# Patient Record
Sex: Female | Born: 1935 | Race: Asian | Hispanic: No | Marital: Married | Smoking: Unknown if ever smoked
Health system: Southern US, Community
[De-identification: ages and names within clinical notes are randomized; demographics above are authoritative.]

## PROBLEM LIST (undated history)

## (undated) ENCOUNTER — Emergency Department (HOSPITAL_COMMUNITY): Payer: Medicare HMO | Source: Home / Self Care

---

## 2017-07-11 ENCOUNTER — Other Ambulatory Visit: Payer: Self-pay

## 2017-07-11 ENCOUNTER — Encounter (HOSPITAL_COMMUNITY): Payer: Self-pay | Admitting: Emergency Medicine

## 2017-07-11 DIAGNOSIS — S0003XA Contusion of scalp, initial encounter: Secondary | ICD-10-CM | POA: Diagnosis not present

## 2017-07-11 DIAGNOSIS — Y999 Unspecified external cause status: Secondary | ICD-10-CM | POA: Insufficient documentation

## 2017-07-11 DIAGNOSIS — Y9389 Activity, other specified: Secondary | ICD-10-CM | POA: Insufficient documentation

## 2017-07-11 DIAGNOSIS — S098XXA Other specified injuries of head, initial encounter: Secondary | ICD-10-CM | POA: Diagnosis present

## 2017-07-11 DIAGNOSIS — Y929 Unspecified place or not applicable: Secondary | ICD-10-CM | POA: Diagnosis not present

## 2017-07-11 DIAGNOSIS — W0110XA Fall on same level from slipping, tripping and stumbling with subsequent striking against unspecified object, initial encounter: Secondary | ICD-10-CM | POA: Diagnosis not present

## 2017-07-11 NOTE — ED Triage Notes (Signed)
Pt daughter reports that pt fell trying to catch the dog and hit back of head. No LOC. Pt alert and oriented x4. No nausea or vomiting. Pt daughter reports incident occurred at 4pm.

## 2017-07-12 ENCOUNTER — Emergency Department (HOSPITAL_COMMUNITY)
Admission: EM | Admit: 2017-07-12 | Discharge: 2017-07-12 | Disposition: A | Discharge status: Home / Self Care | Payer: Medicare HMO | Attending: Emergency Medicine | Admitting: Emergency Medicine

## 2017-07-12 ENCOUNTER — Emergency Department (HOSPITAL_COMMUNITY): Payer: Medicare HMO

## 2017-07-12 DIAGNOSIS — S0003XA Contusion of scalp, initial encounter: Secondary | ICD-10-CM

## 2017-07-12 DIAGNOSIS — W19XXXA Unspecified fall, initial encounter: Secondary | ICD-10-CM

## 2017-07-12 NOTE — Discharge Instructions (Addendum)
You were seen today after a fall.  Your CT scan is negative.  Apply ice and take Tylenol as needed for pain.

## 2017-07-12 NOTE — ED Provider Notes (Signed)
[facility]-EMERGENCY DEPT Provider Note   CSN: [id number] Arrival date & time: 07/11/17  2234     History   Chief Complaint Chief Complaint  Patient presents with  . Fall    HPI Teresa Griffin is a 84 y.o. female.  HPI  This is an 84 year old female who presents with her family with concerns for a fall.  At approximately 4 PM, patient was attempting to restrain a dog when she fell backwards hitting her head.  She did not lose consciousness.  She has not had any nausea or vomiting.  She denies any pain except for when you touch the back of her head.  She denies any neck pain.  She is not on any anticoagulants.  They have used ice with some relief.  Patient's daughter states that her daughter-in-law is a Engineer, civil (consulting) and recommended she get "checked out."  She denies any other pain.  She has been ambulatory.  She has not taken anything for pain.  History reviewed. No pertinent past medical history.  There are no active problems to display for this patient.   History reviewed. No pertinent surgical history.   OB History   None      Home Medications    Prior to Admission medications   Not on File    Family History History reviewed. No pertinent family history.  Social History Social History   Tobacco Use  . Smoking status: Never Smoker  . Smokeless tobacco: Never Used  Substance Use Topics  . Alcohol use: Never    Frequency: Never  . Drug use: Never     Allergies   Patient has no known allergies.   Review of Systems Review of Systems  Respiratory: Negative for shortness of breath.   Cardiovascular: Negative for chest pain.  Gastrointestinal: Negative for nausea and vomiting.  Musculoskeletal: Negative for neck pain.  Skin: Positive for wound.  Neurological: Negative for headaches.  All other systems reviewed and are negative.    Physical Exam Updated Vital Signs BP (!) 177/83 (BP Location: Left Arm)   Pulse 80   Temp 98 F (36.7 C)  (Oral)   Resp 18   Ht 5' (1.524 m)   Wt 51.7 kg (114 lb)   SpO2 100%   BMI 22.26 kg/m   Physical Exam  Constitutional: She is oriented to person, place, and time. She appears well-developed and well-nourished. No distress.  HENT:  Head: Normocephalic.  Hematoma with tenderness palpation to the posterior left occiput  Eyes: Pupils are equal, round, and reactive to light. EOM are normal.  Neck: Normal range of motion. Neck supple.  No midline C-spine tenderness to palpation, step-off, deformity  Cardiovascular: Normal rate, regular rhythm and normal heart sounds.  Pulmonary/Chest: Effort normal. No respiratory distress. She has no wheezes.  Abdominal: Soft. There is no tenderness.  Musculoskeletal: She exhibits no edema or deformity.  Neurological: She is alert and oriented to person, place, and time.  Cranial nerves II through XII intact  Skin: Skin is warm and dry.  Psychiatric: She has a normal mood and affect.  Nursing note and vitals reviewed.    ED Treatments / Results  Labs (all labs ordered are listed, but only abnormal results are displayed) Labs Reviewed - No data to display  EKG None  Radiology Ct Head Wo Contrast  Result Date: 07/12/2017 CLINICAL DATA:  Patient fell at 1600 hours on 07/11/17. Struck posterior head. No loss of consciousness. EXAM: CT HEAD WITHOUT CONTRAST TECHNIQUE: Contiguous  axial images were obtained from the base of the skull through the vertex without intravenous contrast. COMPARISON:  None. FINDINGS: Brain: Mild cerebral atrophy. Mild patchy low-attenuation changes in the deep white matter consistent small vessel ischemia. No evidence of acute infarction, hemorrhage, hydrocephalus, extra-axial collection or mass lesion/mass effect. Vascular: Intracranial arterial vascular calcifications are present. Skull: No depressed skull fractures are identified. Lucencies demonstrated in the posterior skull base likely representing venous legs.  Sinuses/Orbits: Paranasal sinuses and mastoid air cells are not opacified. Other: None IMPRESSION: No acute intracranial abnormalities. Chronic atrophy and small vessel ischemic changes. Electronically Signed   By: Burman Nieves M.D.   On: 07/12/2017 02:32    Procedures Procedures (including critical care time)  Medications Ordered in ED Medications - No data to display   Initial Impression / Assessment and Plan / ED Course  I have reviewed the triage vital signs and the nursing notes.  Pertinent labs & imaging results that were available during my care of the patient were reviewed by me and considered in my medical decision making (see chart for details).     She presents after fall greater than 8 hours ago.  She is clinically well-appearing and nontoxic.  Neurologically intact.  She does have evidence of a small hematoma to her posterior scalp.  Given her age, she falls into high risk criteria even though she is not on any anticoagulation.  I discussed this with the family.  They are agreeable to CT scan.  2:42 AM CT scan negative.  Patient and family updated.  Will discharge home with supportive measures.  Apply ice and take Tylenol as needed.  After history, exam, and medical workup I feel the patient has been appropriately medically screened and is safe for discharge home. Pertinent diagnoses were discussed with the patient. Patient was given return precautions.   Final Clinical Impressions(s) / ED Diagnoses   Final diagnoses:  Hematoma of scalp, initial encounter  Fall, initial encounter    ED Discharge Orders    None       Horton, Mayer Masker, MD 07/12/17 [phone]

## 2020-01-12 ENCOUNTER — Emergency Department (HOSPITAL_COMMUNITY): Payer: Medicare HMO

## 2020-01-12 ENCOUNTER — Other Ambulatory Visit: Payer: Self-pay

## 2020-01-12 ENCOUNTER — Inpatient Hospital Stay (HOSPITAL_COMMUNITY)
Admission: EM | Admit: 2020-01-12 | Discharge: 2020-02-16 | DRG: 082 | Disposition: E | Payer: Medicare HMO | Attending: Pulmonary Disease | Admitting: Pulmonary Disease

## 2020-01-12 ENCOUNTER — Encounter (HOSPITAL_COMMUNITY): Payer: Self-pay | Admitting: *Deleted

## 2020-01-12 DIAGNOSIS — S06349A Traumatic hemorrhage of right cerebrum with loss of consciousness of unspecified duration, initial encounter: Secondary | ICD-10-CM | POA: Diagnosis not present

## 2020-01-12 DIAGNOSIS — S065X9A Traumatic subdural hemorrhage with loss of consciousness of unspecified duration, initial encounter: Secondary | ICD-10-CM | POA: Diagnosis not present

## 2020-01-12 DIAGNOSIS — Z515 Encounter for palliative care: Secondary | ICD-10-CM | POA: Diagnosis not present

## 2020-01-12 DIAGNOSIS — I619 Nontraumatic intracerebral hemorrhage, unspecified: Secondary | ICD-10-CM | POA: Diagnosis present

## 2020-01-12 DIAGNOSIS — W19XXXA Unspecified fall, initial encounter: Secondary | ICD-10-CM | POA: Diagnosis present

## 2020-01-12 DIAGNOSIS — E785 Hyperlipidemia, unspecified: Secondary | ICD-10-CM | POA: Diagnosis present

## 2020-01-12 DIAGNOSIS — Z20822 Contact with and (suspected) exposure to covid-19: Secondary | ICD-10-CM | POA: Diagnosis present

## 2020-01-12 DIAGNOSIS — J690 Pneumonitis due to inhalation of food and vomit: Secondary | ICD-10-CM | POA: Diagnosis present

## 2020-01-12 DIAGNOSIS — J9601 Acute respiratory failure with hypoxia: Secondary | ICD-10-CM | POA: Diagnosis present

## 2020-01-12 DIAGNOSIS — Z79899 Other long term (current) drug therapy: Secondary | ICD-10-CM | POA: Diagnosis not present

## 2020-01-12 DIAGNOSIS — Z66 Do not resuscitate: Secondary | ICD-10-CM | POA: Diagnosis present

## 2020-01-12 DIAGNOSIS — I10 Essential (primary) hypertension: Secondary | ICD-10-CM | POA: Diagnosis present

## 2020-01-12 DIAGNOSIS — S065XAA Traumatic subdural hemorrhage with loss of consciousness status unknown, initial encounter: Secondary | ICD-10-CM

## 2020-01-12 DIAGNOSIS — R4189 Other symptoms and signs involving cognitive functions and awareness: Secondary | ICD-10-CM

## 2020-01-12 DIAGNOSIS — Y92009 Unspecified place in unspecified non-institutional (private) residence as the place of occurrence of the external cause: Secondary | ICD-10-CM | POA: Diagnosis not present

## 2020-01-12 DIAGNOSIS — I609 Nontraumatic subarachnoid hemorrhage, unspecified: Secondary | ICD-10-CM

## 2020-01-12 LAB — CBC WITH DIFFERENTIAL/PLATELET
Abs Immature Granulocytes: 0 10*3/uL (ref 0.00–0.07)
Basophils Absolute: 0 10*3/uL (ref 0.0–0.1)
Basophils Relative: 0 %
Eosinophils Absolute: 0 10*3/uL (ref 0.0–0.5)
Eosinophils Relative: 0 %
HCT: 32.2 % — ABNORMAL LOW (ref 36.0–46.0)
Hemoglobin: 10.4 g/dL — ABNORMAL LOW (ref 12.0–15.0)
Immature Granulocytes: 0 %
Lymphocytes Relative: 9 %
Lymphs Abs: 0.4 10*3/uL — ABNORMAL LOW (ref 0.7–4.0)
MCH: 31.3 pg (ref 26.0–34.0)
MCHC: 32.3 g/dL (ref 30.0–36.0)
MCV: 97 fL (ref 80.0–100.0)
Monocytes Absolute: 0.3 10*3/uL (ref 0.1–1.0)
Monocytes Relative: 6 %
Neutro Abs: 4 10*3/uL (ref 1.7–7.7)
Neutrophils Relative %: 85 %
Platelets: 116 10*3/uL — ABNORMAL LOW (ref 150–400)
RBC: 3.32 MIL/uL — ABNORMAL LOW (ref 3.87–5.11)
RDW: 12.7 % (ref 11.5–15.5)
WBC: 4.7 10*3/uL (ref 4.0–10.5)
nRBC: 0 % (ref 0.0–0.2)

## 2020-01-12 LAB — COMPREHENSIVE METABOLIC PANEL
ALT: 13 U/L (ref 0–44)
AST: 20 U/L (ref 15–41)
Albumin: 3.6 g/dL (ref 3.5–5.0)
Alkaline Phosphatase: 39 U/L (ref 38–126)
Anion gap: 15 (ref 5–15)
BUN: 30 mg/dL — ABNORMAL HIGH (ref 8–23)
CO2: 17 mmol/L — ABNORMAL LOW (ref 22–32)
Calcium: 9.9 mg/dL (ref 8.9–10.3)
Chloride: 106 mmol/L (ref 98–111)
Creatinine, Ser: 1.51 mg/dL — ABNORMAL HIGH (ref 0.44–1.00)
GFR, Estimated: 32 mL/min — ABNORMAL LOW (ref >60)
Glucose, Bld: 215 mg/dL — ABNORMAL HIGH (ref 70–99)
Potassium: 4.1 mmol/L (ref 3.5–5.1)
Sodium: 138 mmol/L (ref 135–145)
Total Bilirubin: 0.7 mg/dL (ref 0.3–1.2)
Total Protein: 7.3 g/dL (ref 6.5–8.1)

## 2020-01-12 LAB — URINALYSIS, ROUTINE W REFLEX MICROSCOPIC
Bacteria, UA: NONE SEEN
Bilirubin Urine: NEGATIVE
Glucose, UA: >=500 mg/dL — AB
Ketones, ur: NEGATIVE mg/dL
Leukocytes,Ua: NEGATIVE
Nitrite: NEGATIVE
Protein, ur: 100 mg/dL — AB
Specific Gravity, Urine: 1.013 (ref 1.005–1.030)
pH: 5 (ref 5.0–8.0)

## 2020-01-12 LAB — RAPID URINE DRUG SCREEN, HOSP PERFORMED
Amphetamines: NOT DETECTED
Barbiturates: NOT DETECTED
Benzodiazepines: NOT DETECTED
Cocaine: NOT DETECTED
Opiates: NOT DETECTED
Tetrahydrocannabinol: NOT DETECTED

## 2020-01-12 LAB — AMMONIA: Ammonia: 17 umol/L (ref 9–35)

## 2020-01-12 LAB — LIPASE, BLOOD: Lipase: 57 U/L — ABNORMAL HIGH (ref 11–51)

## 2020-01-12 LAB — TROPONIN I (HIGH SENSITIVITY)
Troponin I (High Sensitivity): 28 ng/L — ABNORMAL HIGH (ref <18)
Troponin I (High Sensitivity): 33 ng/L — ABNORMAL HIGH (ref <18)

## 2020-01-12 LAB — RESP PANEL BY RT-PCR (FLU A&B, COVID) ARPGX2
Influenza A by PCR: NEGATIVE
Influenza B by PCR: NEGATIVE
SARS Coronavirus 2 by RT PCR: NEGATIVE

## 2020-01-12 LAB — TSH: TSH: 0.67 u[IU]/mL (ref 0.350–4.500)

## 2020-01-12 LAB — LACTIC ACID, PLASMA
Lactic Acid, Venous: 4.6 mmol/L (ref 0.5–1.9)
Lactic Acid, Venous: 6.8 mmol/L (ref 0.5–1.9)

## 2020-01-12 LAB — ETHANOL: Alcohol, Ethyl (B): <10 mg/dL (ref <10)

## 2020-01-12 LAB — MRSA PCR SCREENING: MRSA by PCR: NEGATIVE

## 2020-01-12 MED ORDER — GLYCOPYRROLATE 1 MG PO TABS
1.0000 mg | ORAL_TABLET | ORAL | Status: DC | PRN
  Filled 2020-01-12: qty 1

## 2020-01-12 MED ORDER — CLEVIDIPINE BUTYRATE 0.5 MG/ML IV EMUL
0.0000 mg/h | INTRAVENOUS | Status: DC
  Administered 2020-01-12: 5 mg/h via INTRAVENOUS
  Filled 2020-01-12: qty 50

## 2020-01-12 MED ORDER — VANCOMYCIN HCL 750 MG/150ML IV SOLN: 750.0000 mg | INTRAVENOUS | Status: DC

## 2020-01-12 MED ORDER — LACTATED RINGERS IV BOLUS (SEPSIS)
1000.0000 mL | Freq: Once | INTRAVENOUS | Status: AC
  Administered 2020-01-12: 1000 mL via INTRAVENOUS

## 2020-01-12 MED ORDER — ETOMIDATE 2 MG/ML IV SOLN
INTRAVENOUS | Status: AC | PRN
  Administered 2020-01-12: 20 mg via INTRAVENOUS

## 2020-01-12 MED ORDER — MIDAZOLAM HCL 2 MG/2ML IJ SOLN: 2.0000 mg | INTRAMUSCULAR | Status: DC | PRN

## 2020-01-12 MED ORDER — ORAL CARE MOUTH RINSE
15.0000 mL | OROMUCOSAL | Status: DC
  Administered 2020-01-12 – 2020-01-15 (×28): 15 mL via OROMUCOSAL

## 2020-01-12 MED ORDER — ACETAMINOPHEN 650 MG RE SUPP: 650.0000 mg | Freq: Four times a day (QID) | RECTAL | Status: DC | PRN

## 2020-01-12 MED ORDER — PIPERACILLIN-TAZOBACTAM IN DEX 2-0.25 GM/50ML IV SOLN
2.2500 g | Freq: Three times a day (TID) | INTRAVENOUS | Status: DC
  Filled 2020-01-12: qty 50

## 2020-01-12 MED ORDER — CHLORHEXIDINE GLUCONATE 0.12% ORAL RINSE (MEDLINE KIT)
15.0000 mL | Freq: Two times a day (BID) | OROMUCOSAL | Status: DC
  Administered 2020-01-12 – 2020-01-15 (×7): 15 mL via OROMUCOSAL

## 2020-01-12 MED ORDER — DIPHENHYDRAMINE HCL 50 MG/ML IJ SOLN: 25.0000 mg | INTRAMUSCULAR | Status: DC | PRN

## 2020-01-12 MED ORDER — LACTATED RINGERS IV BOLUS (SEPSIS)
500.0000 mL | Freq: Once | INTRAVENOUS | Status: AC
  Administered 2020-01-12: 500 mL via INTRAVENOUS

## 2020-01-12 MED ORDER — GLYCOPYRROLATE 0.2 MG/ML IJ SOLN: 0.2000 mg | INTRAMUSCULAR | Status: DC | PRN

## 2020-01-12 MED ORDER — LACTATED RINGERS IV BOLUS (SEPSIS)
250.0000 mL | Freq: Once | INTRAVENOUS | Status: AC
  Administered 2020-01-12: 250 mL via INTRAVENOUS

## 2020-01-12 MED ORDER — CHLORHEXIDINE GLUCONATE CLOTH 2 % EX PADS
6.0000 | MEDICATED_PAD | Freq: Every day | CUTANEOUS | Status: DC
  Administered 2020-01-12 – 2020-01-15 (×3): 6 via TOPICAL

## 2020-01-12 MED ORDER — MORPHINE SULFATE (PF) 2 MG/ML IV SOLN
2.0000 mg | INTRAVENOUS | Status: DC | PRN
  Administered 2020-01-13: 2 mg via INTRAVENOUS
  Administered 2020-01-13: 4 mg via INTRAVENOUS
  Filled 2020-01-12: qty 2
  Filled 2020-01-12: qty 1

## 2020-01-12 MED ORDER — POLYVINYL ALCOHOL 1.4 % OP SOLN
1.0000 [drp] | Freq: Four times a day (QID) | OPHTHALMIC | Status: DC | PRN
  Filled 2020-01-12: qty 15

## 2020-01-12 MED ORDER — SODIUM CHLORIDE 0.9 % IV SOLN
2.0000 g | Freq: Once | INTRAVENOUS | Status: DC
  Filled 2020-01-12: qty 2

## 2020-01-12 MED ORDER — PIPERACILLIN-TAZOBACTAM IN DEX 2-0.25 GM/50ML IV SOLN: 2.2500 g | Freq: Once | INTRAVENOUS | Status: DC

## 2020-01-12 MED ORDER — IOHEXOL 350 MG/ML SOLN
80.0000 mL | Freq: Once | INTRAVENOUS | Status: AC | PRN
  Administered 2020-01-12: 80 mL via INTRAVENOUS

## 2020-01-12 MED ORDER — VANCOMYCIN HCL IN DEXTROSE 1-5 GM/200ML-% IV SOLN
1000.0000 mg | Freq: Once | INTRAVENOUS | Status: AC
  Administered 2020-01-12: 1000 mg via INTRAVENOUS
  Filled 2020-01-12: qty 200

## 2020-01-12 MED ORDER — LACTATED RINGERS IV SOLN: INTRAVENOUS | Status: DC

## 2020-01-12 MED ORDER — PROPOFOL 1000 MG/100ML IV EMUL
5.0000 ug/kg/min | INTRAVENOUS | Status: DC
  Administered 2020-01-12: 5 ug/kg/min via INTRAVENOUS
  Filled 2020-01-12: qty 100

## 2020-01-12 MED ORDER — ACETAMINOPHEN 325 MG PO TABS: 650.0000 mg | ORAL_TABLET | Freq: Four times a day (QID) | ORAL | Status: DC | PRN

## 2020-01-12 MED ORDER — ROCURONIUM BROMIDE 50 MG/5ML IV SOLN
INTRAVENOUS | Status: AC | PRN
  Administered 2020-01-12: 2720 mg via INTRAVENOUS

## 2020-01-12 MED ORDER — PIPERACILLIN-TAZOBACTAM 3.375 G IVPB 30 MIN
3.3750 g | Freq: Once | INTRAVENOUS | Status: DC
  Administered 2020-01-12: 3.375 g via INTRAVENOUS
  Filled 2020-01-12: qty 50

## 2020-01-12 NOTE — Progress Notes (Signed)
[facility] notified (CDS). Referral number: [id number]. Thank you

## 2020-01-12 NOTE — Progress Notes (Signed)
Spoke with son, Maryjane Hurter, and daughter-in-law who is a [employer] manager. Provided with details of brain trauma and reassured them that we will keep patient comfortable as we await family's arrival. They predict that daughter, who is in [place], will not be able to arrive until Thursday.

## 2020-01-12 NOTE — ED Notes (Signed)
Called to give report to 4N, nurse for patient is off the floor, said to give her 10-15 minutes.

## 2020-01-12 NOTE — Consult Note (Signed)
Reason for Consult: ICH Referring Physician: EDP  Teresa Griffin is an 86 y.o. female.   HPI:  86 year old female presented to the ED today after sustaining a fall yesterday morning. Her husband found her to be unresponsive this morning. She was intubated in the ED for airway protection since she was aspirating.   History reviewed. No pertinent past medical history.  History reviewed. No pertinent surgical history.  Not on File  Social History   Tobacco Use  . Smoking status: Unknown If Ever Smoked  Substance Use Topics  . Alcohol use: Not on file    History reviewed. No pertinent family history.   Review of Systems  Positive ROS: intubated and sedated  All other systems have been reviewed and were otherwise negative with the exception of those mentioned in the HPI and as above.  Objective: Vital signs in last 24 hours: Temp:  [97.2 F (36.2 C)-99.5 F (37.5 C)] 97.6 F (36.4 C) (11/27 1215) Pulse Rate:  [79-114] 108 (11/27 1215) Resp:  [18-29] 27 (11/27 1215) BP: (145-178)/(66-102) 145/74 (11/27 1215) SpO2:  [86 %-100 %] 86 % (11/27 1215) FiO2 (%):  [40 %] 40 % (11/27 0934) Weight:  [54.4 kg] 54.4 kg (11/27 0938)  General Appearance: intubated  Head: Normocephalic, without obvious abnormality, atraumatic Eyes: Left pupil fixed 4, right pupil fixed2   Throat: ETT Lungs: respirations unlabored, ETT Heart: Regular rate and rhythm Pulses: 2+ and symmetric all extremities Skin: Skin color, texture, turgor normal, no rashes or lesions  NEUROLOGIC:   Mental status: obtunded Motor Exam - 0/5 all extremities Sensory Exam - uta Reflexes: not tested Coordination uta Gait -uta Balance - uta Cranial Nerves: I: smell Not tested  II: visual acuity  OS: na  OD: na  II: visual fields fixed  II: pupils L 4 fixed, right 2 fixed  III,VII: ptosis None  III,IV,VI: extraocular muscles  fixed  V: mastication uta  V: facial light touch sensation  uta  V,VII: corneal reflex  No  corneal reflex  VII: facial muscle function - upper  uta  VII: facial muscle function - lower uta  VIII: hearing uta  IX: soft palate elevation  uta  IX,X: gag reflex none  XI: trapezius strength  uta  XI: sternocleidomastoid strength uta  XI: neck flexion strength  uta  XII: tongue strength  uta    Data Review Lab Results  Component Value Date   WBC 4.7 01-21-2020   HGB 10.4 (L) 2020-01-21   HCT 32.2 (L) Jan 21, 2020   MCV 97.0 01-21-2020   PLT 116 (L) Jan 21, 2020   Lab Results  Component Value Date   NA 138 2020-01-21   K 4.1 2020-01-21   CL 106 01-21-2020   CO2 17 (L) Jan 21, 2020   BUN 30 (H) 21-Jan-2020   CREATININE 1.51 (H) 2020/01/21   GLUCOSE 215 (H) Jan 21, 2020   No results found for: INR, PROTIME  Radiology: CT Head Wo Contrast  Result Date: 01-21-2020 CLINICAL DATA:  Pain following fall EXAM: CT HEAD WITHOUT CONTRAST CT CERVICAL SPINE WITHOUT CONTRAST TECHNIQUE: Multidetector CT imaging of the head and cervical spine was performed following the standard protocol without intravenous contrast. Multiplanar CT image reconstructions of the cervical spine were also generated. COMPARISON:  None. FINDINGS: CT HEAD FINDINGS Brain: There is extensive acute subdural hematoma seen on the left involving the left temporal, left frontal, and left parietal lobes. This subdural hematoma on the left is maximum in thickness in the temporal region with a maximum  measured thickness of 1.7 cm. This subdural hematoma is causing 1.5 cm of midline shift toward the right. There is an apparent frontal contusion with hemorrhage and slight surrounding edema anteriorly measuring 1.6 x 1.5 cm. There is immediately adjacent subdural hemorrhage tracking along the falx. There is subarachnoid hemorrhage in the right temporoparietal junction region without appreciable mass effect. More inferiorly, there is a left tentorial subdural hematoma. There is subarachnoid hemorrhage in the left caudate nucleus tail  region in adjacent inferomedial left temporal lobe which is causing mass effect on the uncus on the left. There is hemorrhage in the anterior superior cerebellum which is felt to be subarachnoid with mild surrounding edema measuring 2.3 x 1.2 cm. There is also subarachnoid hemorrhage tracking from the lateral left midbrain into the upper pons region. Vascular: No hyperdense vessel. There is calcification in each carotid siphon region. Skull: There is a nondisplaced fracture of each anterior temporal bone. No depressed skull fracture evident. Sinuses/Orbits: Mucosal thickening noted in several ethmoid air cells. Orbits appear symmetric bilaterally. Other: Mastoid air cells are clear. CT CERVICAL SPINE FINDINGS Alignment: There is cervical dextroscoliosis. No evidence spondylolisthesis. Skull base and vertebrae: Skull base and craniocervical junction regions unremarkable. There is pannus posterior to the odontoid which is not causing significant impression on craniocervical junction. No fracture is appreciable. No blastic or lytic bone lesions. Soft tissues and spinal canal: Prevertebral soft tissues and predental space regions are within normal limits. Note that the patient is intubated. No evident cord or canal hematoma. No paraspinous lesions. Disc levels: There is moderately severe disc space narrowing at C5-6. There is milder disc space narrowing at C3-4 and C4-5. There are prominent anterior and posterior osteophytes at C4, C5, and C6. There is a degree spinal stenosis at C4-5 due to central calcified osteophyte impressing on the ventral cord. There is spinal stenosis due to large central and right paracentral calcified spurring and disc protrusion at C5-6 with marked impression on the proximal C6 nerve root on the right due to bony hypertrophy and disc protrusion. Facet hypertrophy noted at several levels. Upper chest: Patient intubated. Mild scarring in the left upper lobe. No pneumothorax evident. Noah edema  or consolidation. Other: Foci of calcification noted in each carotid artery. IMPRESSION: CT head: 1. Acute subdural hematoma in the left frontal, left temporal, and left parietal regions measuring maximum thickness in the temporal region 1.7 cm. 1.5 cm of midline shift to the right at the lateral and third ventricular level. 2.  Falcine and left tentorial acute subdural hematomas. 3. Brain contusion with hemorrhage in the anterior superior frontal lobe, more severe on the left than on the right. This apparent contusion measures 1.6 x 1.5 cm. 4. Subarachnoid hemorrhage in the right temporoparietal region without mass effect. 5. Subarachnoid hemorrhage in the inferomedial left temporal lobe involving a portion of the tail of the caudate nucleus on the left causing mass effect on the left uncus. Subarachnoid hemorrhage seen in the lateral lower left midbrain extending into the left upper pons. Subarachnoid hemorrhage in the anterior upper right cerebellum measuring 2.3 x 1.2 cm with surrounding edema causing mass effect on the fourth ventricle. 6. Nondisplaced fractures of each anterior temporal bone. No displaced skull fractures. 7.  Foci of arterial vascular calcification. 8.  Mucosal thickening in several ethmoid air cells. CT cervical spine: 1.  No fracture.  No spondylolisthesis.  Scoliosis noted. 2. Multilevel arthropathy. Spinal stenosis due to bony hypertrophy centrally at C4-5 and centrally and toward the  right at C5-6. Marked impression on the right C6 nerve root near its origin due to bony hypertrophy. 3. Foci of arterial vascular calcification involving each carotid artery. Critical Value/emergent results were called by telephone at the time of interpretation on 01/03/2020 at 11:28 am to provider [facility] , who verbally acknowledged these results. Electronically Signed   By: Bretta Bang III M.D.   On: 12/21/2019 11:30   CT Cervical Spine Wo Contrast  Result Date: 12/17/2019 CLINICAL  DATA:  Pain following fall EXAM: CT HEAD WITHOUT CONTRAST CT CERVICAL SPINE WITHOUT CONTRAST TECHNIQUE: Multidetector CT imaging of the head and cervical spine was performed following the standard protocol without intravenous contrast. Multiplanar CT image reconstructions of the cervical spine were also generated. COMPARISON:  None. FINDINGS: CT HEAD FINDINGS Brain: There is extensive acute subdural hematoma seen on the left involving the left temporal, left frontal, and left parietal lobes. This subdural hematoma on the left is maximum in thickness in the temporal region with a maximum measured thickness of 1.7 cm. This subdural hematoma is causing 1.5 cm of midline shift toward the right. There is an apparent frontal contusion with hemorrhage and slight surrounding edema anteriorly measuring 1.6 x 1.5 cm. There is immediately adjacent subdural hemorrhage tracking along the falx. There is subarachnoid hemorrhage in the right temporoparietal junction region without appreciable mass effect. More inferiorly, there is a left tentorial subdural hematoma. There is subarachnoid hemorrhage in the left caudate nucleus tail region in adjacent inferomedial left temporal lobe which is causing mass effect on the uncus on the left. There is hemorrhage in the anterior superior cerebellum which is felt to be subarachnoid with mild surrounding edema measuring 2.3 x 1.2 cm. There is also subarachnoid hemorrhage tracking from the lateral left midbrain into the upper pons region. Vascular: No hyperdense vessel. There is calcification in each carotid siphon region. Skull: There is a nondisplaced fracture of each anterior temporal bone. No depressed skull fracture evident. Sinuses/Orbits: Mucosal thickening noted in several ethmoid air cells. Orbits appear symmetric bilaterally. Other: Mastoid air cells are clear. CT CERVICAL SPINE FINDINGS Alignment: There is cervical dextroscoliosis. No evidence spondylolisthesis. Skull base and  vertebrae: Skull base and craniocervical junction regions unremarkable. There is pannus posterior to the odontoid which is not causing significant impression on craniocervical junction. No fracture is appreciable. No blastic or lytic bone lesions. Soft tissues and spinal canal: Prevertebral soft tissues and predental space regions are within normal limits. Note that the patient is intubated. No evident cord or canal hematoma. No paraspinous lesions. Disc levels: There is moderately severe disc space narrowing at C5-6. There is milder disc space narrowing at C3-4 and C4-5. There are prominent anterior and posterior osteophytes at C4, C5, and C6. There is a degree spinal stenosis at C4-5 due to central calcified osteophyte impressing on the ventral cord. There is spinal stenosis due to large central and right paracentral calcified spurring and disc protrusion at C5-6 with marked impression on the proximal C6 nerve root on the right due to bony hypertrophy and disc protrusion. Facet hypertrophy noted at several levels. Upper chest: Patient intubated. Mild scarring in the left upper lobe. No pneumothorax evident. Noah edema or consolidation. Other: Foci of calcification noted in each carotid artery. IMPRESSION: CT head: 1. Acute subdural hematoma in the left frontal, left temporal, and left parietal regions measuring maximum thickness in the temporal region 1.7 cm. 1.5 cm of midline shift to the right at the lateral and third ventricular level. 2.  Falcine and left tentorial acute subdural hematomas. 3. Brain contusion with hemorrhage in the anterior superior frontal lobe, more severe on the left than on the right. This apparent contusion measures 1.6 x 1.5 cm. 4. Subarachnoid hemorrhage in the right temporoparietal region without mass effect. 5. Subarachnoid hemorrhage in the inferomedial left temporal lobe involving a portion of the tail of the caudate nucleus on the left causing mass effect on the left uncus.  Subarachnoid hemorrhage seen in the lateral lower left midbrain extending into the left upper pons. Subarachnoid hemorrhage in the anterior upper right cerebellum measuring 2.3 x 1.2 cm with surrounding edema causing mass effect on the fourth ventricle. 6. Nondisplaced fractures of each anterior temporal bone. No displaced skull fractures. 7.  Foci of arterial vascular calcification. 8.  Mucosal thickening in several ethmoid air cells. CT cervical spine: 1.  No fracture.  No spondylolisthesis.  Scoliosis noted. 2. Multilevel arthropathy. Spinal stenosis due to bony hypertrophy centrally at C4-5 and centrally and toward the right at C5-6. Marked impression on the right C6 nerve root near its origin due to bony hypertrophy. 3. Foci of arterial vascular calcification involving each carotid artery. Critical Value/emergent results were called by telephone at the time of interpretation on 12/20/2019 at 11:28 am to provider [facility] , who verbally acknowledged these results. Electronically Signed   By: Bretta Bang III M.D.   On: 01/11/2020 11:30   DG Chest Portable 1 View  Result Date: 12/27/2019 CLINICAL DATA:  Hypoxia.  Recent fall EXAM: PORTABLE CHEST 1 VIEW COMPARISON:  None. FINDINGS: Endotracheal tube tip is 7.9 cm above the carina. Nasogastric tube tip is below the diaphragm. The side port is immediately distal to the gastroesophageal junction. No pneumothorax. There is airspace opacity in left mid and lower lung regions as well as in the right lower lung region. Heart size normal. Pulmonary vascularity normal. There is dilatation of the aortic arch and proximal descending thoracic aorta. There is aortic atherosclerosis. No pneumothorax.  Degenerative change in each shoulder. IMPRESSION: 1.  Tube and catheter positions as described without pneumothorax. 2. Airspace opacity, likely multifocal pneumonia, seen in the left mid and lower lung regions as well as in the right lower lung region. 3.  Enlargement of the aortic arch and proximal descending thoracic aorta consistent with aneurysmal dilatation. Underlying dissection cannot be excluded by radiography. This finding may warrant contrast enhanced chest CT to further evaluate. Aortic Atherosclerosis (ICD10-I70.0). Electronically Signed   By: Bretta Bang III M.D.   On: 01/11/2020 10:05   CT Angio Chest/Abd/Pel for Dissection W and/or Wo Contrast  Result Date: 12/25/2019 CLINICAL DATA:  86 year old female found unresponsive, concern for aortic aneurysm on chest radiograph. EXAM: CT ANGIOGRAPHY CHEST, ABDOMEN AND PELVIS TECHNIQUE: Non-contrast CT of the chest was initially obtained. Multidetector CT imaging through the chest, abdomen and pelvis was performed using the standard protocol during bolus administration of intravenous contrast. Multiplanar reconstructed images and MIPs were obtained and reviewed to evaluate the vascular anatomy. CONTRAST:  80mL OMNIPAQUE IOHEXOL 350 MG/ML SOLN COMPARISON:  Chest radiograph from earlier the same day FINDINGS: CTA CHEST FINDINGS Cardiovascular: Normal caliber sinuses of Valsalva measuring 25 x 28 x 24 mm with trileaflet aortic valve. The sino-tubular junction measures 27 mm. The ascending thoracic aorta measures up to 32 mm. The aortic arch measures up to 32 mm. Conventional branching pattern without flow-limiting stenosis or aneurysm. In the proximal descending thoracic aorta there is a large, fusiform and partially thrombosed aneurysm measuring 71  x 60 by 57 mm. The distal thoracic aorta is normal in caliber measuring 22 mm just inferior to the level of the carina. Scattered atherosclerotic calcifications in the descending thoracic aorta. The heart is normal in size. Severe coronary atherosclerotic calcifications. No pericardial effusion. Mediastinum/Nodes: No enlarged mediastinal, hilar, or axillary lymph nodes. Thyroid gland, trachea, and esophagus demonstrate no significant findings. Enteric tube  courses through the esophagus. Lungs/Pleura: Endotracheal tube in place with the distal tip in the superior thoracic trachea. Nonobstructive layering secretions within the distal trachea and left mainstem bronchus. Multifocal consolidative opacities in the dependent portions of the left lower and left upper lobe with air bronchograms. Similar faint but smaller opacifications in the perifissural right lower lobe. Musculoskeletal: Ill-defined sclerotic foci in the anterior aspect of the T1 vertebral body (measuring 1 cm) and in the medial aspect of the right seventh rib (measuring 0.9 cm), favored represent benign etiology such as bone islands. Multilevel degenerative changes of the thoracic spine. No acute osseous abnormality. Review of the MIP images confirms the above findings. CTA ABDOMEN AND PELVIS FINDINGS VASCULAR Aorta: Normal caliber, patent. Scattered atherosclerotic calcifications. Celiac: Patent without evidence of aneurysm, dissection, vasculitis or significant stenosis. SMA: Mild ostial stenosis secondary to atherosclerotic plaque. Otherwise widely patent. Renals: Single right and dual left renal arteries are patent without evidence of aneurysm, dissection, vasculitis, fibromuscular dysplasia or significant stenosis. IMA: Patent without evidence of aneurysm, dissection, vasculitis or significant stenosis. Inflow: Patent without evidence of aneurysm, dissection, vasculitis or significant stenosis. Scattered atherosclerotic calcification. Veins: No obvious venous abnormality within the limitations of this arterial phase study. Review of the MIP images confirms the above findings. NON-VASCULAR Hepatobiliary: No focal liver abnormality is seen. Status post cholecystectomy. No abnormal biliary dilatation. Pancreas: Unremarkable. No pancreatic ductal dilatation or surrounding inflammatory changes. Spleen: Normal in size without focal abnormality. Adrenals/Urinary Tract: Adrenal glands are unremarkable. Kidneys  are normal, without renal calculi, focal lesion, or hydronephrosis. Bladder partially decompressed with Foley catheter in place. Stomach/Bowel: Gastric decompression tube with tip in the gastric body. Stomach is within normal limits. Appendix appears normal. No evidence of bowel wall thickening, distention, or inflammatory changes. Lymphatic: No abdominopelvic lymphadenopathy. Reproductive: Retroflexed uterus with multiple internal calcifications. No adnexal mass. Other: No abdominal wall hernia or abnormality. No abdominopelvic ascites. Musculoskeletal: No acute or significant osseous findings. Review of the MIP images confirms the above findings. IMPRESSION: VASCULAR: 1. 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 Cardiothoracic surgery consultation recommended due to increased risk of rupture for arch aneurysm ? 5.5 cm. This recommendation follows 2010 ACCF/AHA/AATS/ACR/ASA/SCA/SCAI/SIR/STS/SVM Guidelines for the Diagnosis and Management of Patients With Thoracic Aortic Disease. Circulation. 2010; 121: J884-Z660. Aortic aneurysm NOS (ICD10-I71.9) 2.  Aortic Atherosclerosis (ICD10-I70.0). NONVASCULAR: 1. Scattered consolidative opacities in the dependent portions of the left upper and lower lobes as well as a smaller focus in the peripheral right lower lobe favored to represent sequela of aspiration given provided history and recent intubation. Multifocal pneumonia could appear similarly. 2. Endotracheal tube in place in the superior thoracic trachea. Consider tip advancement toward the carina by 3-5 cm. 3. Gastric decompression tube tip appropriately positioned in the gastric body. Marliss Coots, MD [facility] Radiology Electronically Signed   By: Marliss Coots MD   On: 2020-02-10 12:01    Assessment/Plan: 86 year old female  presented to the ED this morning after  being found unresponsive by her husband. She fell yesterday morning. CT head today showed a large left SDH with midline shift and mass effect, she also has an ICH in the frontal lobe and sah in the right temporoparietal region. This is a devastating injury and given her age and comorbidities I do not think any surgical intervention is warranted at this time. We did have a lengthy discussion with the grandson and her husband about the plan of care. I think it is in her best interest for [place] to provide comfort care at this time. They are going to discuss this with family.    Tiana Loft Tashawna Thom February 09, 2020 12:41 PM

## 2020-01-12 NOTE — H&P (Signed)
NAME:  Teresa Griffin, MRN:  [id number], DOB:  1926-04-20, LOS: 0 ADMISSION DATE:  01/30/2020, CONSULTATION DATE: 01-30-2020 REFERRING MD: Dr. Rush Landmark, CHIEF COMPLAINT: Intracranial hemorrhage  Brief History    History of present illness   This is a 86 year old female past medical history of hypertension, hyperlipidemia on metoprolol, Benicar, Zocor.  Patient had a fall yesterday morning.  Became unresponsive this morning was intubated for airway protection.  CT head revealed large left subdural hematoma with midline shift and mass-effect.  She was also found to have a intracranial hemorrhage in the frontal lobe and a subarachnoid hemorrhage.  Patient was evaluated by neurosurgery and was felt as if this was a devastating irreversible injury to the brain and warranted no surgical intervention.  Recommending transition to comfort care.  The patient's husband is present at bedside and they have 2 children one in [place] one in the [place] that are making travel arrangements to come to the hospital.  Pulmonary critical care was consulted for recommendations of ICU admission for ventilator support in preparation for comfort care transition once family arrives.  Past Medical History  History reviewed. No pertinent past medical history.   Significant Hospital Events     Consults:  PCCM Neurosurgery  Procedures:    Significant Diagnostic Tests:    Micro Data:    Antimicrobials:     Interim history/subjective:  Per HPI above  Objective   Blood pressure 131/67, pulse 84, temperature 98.1 F (36.7 C), resp. rate (!) 29, height 4\' 9"  (1.448 m), weight 54.4 kg, SpO2 100 %.    Vent Mode: PRVC FiO2 (%):  [40 %] 40 % Set Rate:  [20 bmp] 20 bmp Vt Set:  [400 mL] 400 mL PEEP:  [5 cmH20] 5 cmH20 Plateau Pressure:  [18 cmH20] 18 cmH20  No intake or output data in the 24 hours ending 2020-01-30 1332 Filed Weights   2020/01/30 0938  Weight: 54.4 kg    Examination: General:  Elderly female intubated on mechanical life support HENT: Small abrasion, left pupil fixed 4 mm, right pupil fixed 2 mm Lungs: Bilateral ventilated breath sounds Cardiovascular: Regular rate rhythm S1-S2 Abdomen: Soft nontender nondistended Extremities: No significant edema Neuro: No response to pain, no withdrawal to pain GU: Deferred  Resolved Hospital Problem list     Assessment & Plan:   Devastating neurologic injury Large left subdural hematoma with midline shift Right frontal lobe intracranial hemorrhage Right temporoparietal subarachnoid hemorrhage Acute hypoxemic respiratory failure requiring intubation mechanical ventilation secondary to above Plan: Admit to the ICU in preparation for comfort care measures and withdrawal from mechanical support once family is present. Comfort care order set was placed Continue adult mechanical ventilator protocol No labs needed for follow-up. I explained to the patient's husband if she survives we would keep her on mechanical support until his son and daughter are able to be with him.   Best practice (evaluated daily)   Diet: N.p.o. Pain/Anxiety/Delirium protocol (if indicated): As needed morphine and Versed VAP protocol (if indicated): Not applicable DVT prophylaxis: Unable to give GI prophylaxis: Not applicable Glucose control: Not applicable, comfort measures Mobility: Bedrest last date of multidisciplinary goals of care discussion 01-30-20 Family and staff present myself, ER nurse, patient's husband Summary of discussion plan for admission to the hospital for comfort care transition Code Status: DNR Disposition: ICU with plans for comfort care transition once family present.  Labs   CBC: Recent Labs  Lab 01-30-2020 0903  WBC 4.7  NEUTROABS 4.0  HGB  10.4*  HCT 32.2*  MCV 97.0  PLT 116*    Basic Metabolic Panel: Recent Labs  Lab 12/31/2019 0903  NA 138  K 4.1  CL 106  CO2 17*  GLUCOSE 215*  BUN 30*  CREATININE  1.51*  CALCIUM 9.9   GFR: Estimated Creatinine Clearance: 17.2 mL/min (A) (by C-G formula based on SCr of 1.51 mg/dL (H)). Recent Labs  Lab 01/10/2020 0903 01/11/2020 1201  WBC 4.7  --   LATICACIDVEN 4.6* 6.8*    Liver Function Tests: Recent Labs  Lab 01/10/2020 0903  AST 20  ALT 13  ALKPHOS 39  BILITOT 0.7  PROT 7.3  ALBUMIN 3.6   Recent Labs  Lab 12/17/2019 0903  LIPASE 57*   Recent Labs  Lab 01/02/2020 1018  AMMONIA 17    ABG No results found for: PHART, PCO2ART, PO2ART, HCO3, TCO2, ACIDBASEDEF, O2SAT   Coagulation Profile: No results for input(s): INR, PROTIME in the last 168 hours.  Cardiac Enzymes: No results for input(s): CKTOTAL, CKMB, CKMBINDEX, TROPONINI in the last 168 hours.  HbA1C: No results found for: HGBA1C  CBG: No results for input(s): GLUCAP in the last 168 hours.  Review of Systems:    Past Medical History  She,  has no past medical history on file.  Hypertension hyperlipidemia  Surgical History   History reviewed. No pertinent surgical history.   Social History      Family History   Her family history is not on file.   Allergies Not on File   Home Medications  Prior to Admission medications   Medication Sig Start Date End Date Taking? Authorizing Provider  fluticasone Aleda Grana) 50 MCG/ACT nasal spray  11/23/19   [provider]  folic acid (FOLVITE) 800 MCG tablet  09/10/19   [provider]  metoprolol succinate (TOPROL-XL) 100 MG 24 hr tablet  11/21/19   [provider]  olmesartan (BENICAR) 20 MG tablet  11/21/19   [provider]  omeprazole (PRILOSEC) 40 MG capsule Take 40 mg by mouth daily. 11/16/19   [provider]  potassium chloride (MICRO-K) 10 MEQ CR capsule  11/30/19   [provider]  simvastatin (ZOCOR) 20 MG tablet Take 20 mg by mouth at bedtime. 12/24/19   [provider]  triamcinolone (KENALOG) 0.1 %  12/21/19   [provider]     Josephine Igo, DO [facility] 12/18/2019 1:32 PM

## 2020-01-12 NOTE — Progress Notes (Signed)
Patient intubated due to AMS, and airway protection, good color change on ETCO2 detector SATS 100%, placed on above vent settings.

## 2020-01-12 NOTE — ED Provider Notes (Signed)
[facility] EMERGENCY DEPARTMENT Provider Note   CSN: [id number] Arrival date & time: 12/23/2019  2423     History Chief Complaint  Patient presents with  . Fall  . Altered Mental Status    Teresa Griffin is a 86 y.o. female.   LVL 5 caveat for unresponsiveness.   The history is provided by the EMS personnel. The history is limited by the condition of the patient.  Altered Mental Status Presenting symptoms: unresponsiveness   Severity:  Severe Most recent episode:  Today Episode history:  Single Timing:  Constant Progression:  Unchanged Chronicity:  New      No past medical history on file.  There are no problems to display for this patient.    OB History   No obstetric history on file.     No family history on file.  Social History   Tobacco Use  . Smoking status: Not on file  Substance Use Topics  . Alcohol use: Not on file  . Drug use: Not on file    Home Medications Prior to Admission medications   Not on File    Allergies    Patient has no allergy information on record.  Review of Systems   Review of Systems  Unable to perform ROS: Mental status change    Physical Exam Updated Vital Signs BP (!) 178/84   Pulse 79   Temp 99.1 F (37.3 C)   Resp (!) 26   Ht 4\' 9"  (1.448 m)   Wt 54.4 kg   SpO2 100%   BMI 25.97 kg/m   Physical Exam Constitutional:      General: She is in acute distress.     Appearance: She is ill-appearing. She is not toxic-appearing or diaphoretic.     Interventions: Cervical collar in place.  HENT:     Head: Normocephalic and atraumatic.     Nose: Nose normal.     Mouth/Throat:     Mouth: Mucous membranes are dry.  Eyes:     General:        Right eye: No discharge.        Left eye: No discharge.  Neck:     Comments: In c collar Cardiovascular:     Rate and Rhythm: Normal rate.     Pulses: Normal pulses.  Pulmonary:     Effort: Respiratory distress present.     Breath sounds: Rhonchi  present.  Abdominal:     Tenderness: There is no abdominal tenderness.  Musculoskeletal:        General: No deformity.  Skin:    Findings: No rash.  Neurological:     Mental Status: She is unresponsive.     GCS: GCS eye subscore is 1. GCS verbal subscore is 1. GCS motor subscore is 3.     Cranial Nerves: No dysarthria.     Motor: Abnormal muscle tone present. No seizure activity.     Comments: On arrival, GCS is 5 with aspiration ongoing.  Patient will withdraw both of her feet to touch but will not withdrawal hands.  No verbal response and no pupil response.  Patient does have larger left pupil than right and left is sluggish.     ED Results / Procedures / Treatments   Labs (all labs ordered are listed, but only abnormal results are displayed) Labs Reviewed  CBC WITH DIFFERENTIAL/PLATELET - Abnormal; Notable for the following components:      Result Value   RBC 3.32 (*)  Hemoglobin 10.4 (*)    HCT 32.2 (*)    Platelets 116 (*)    Lymphs Abs 0.4 (*)    All other components within normal limits  COMPREHENSIVE METABOLIC PANEL - Abnormal; Notable for the following components:   CO2 17 (*)    Glucose, Bld 215 (*)    BUN 30 (*)    Creatinine, Ser 1.51 (*)    GFR, Estimated 32 (*)    All other components within normal limits  LIPASE, BLOOD - Abnormal; Notable for the following components:   Lipase 57 (*)    All other components within normal limits  LACTIC ACID, PLASMA - Abnormal; Notable for the following components:   Lactic Acid, Venous 4.6 (*)    All other components within normal limits  URINALYSIS, ROUTINE W REFLEX MICROSCOPIC - Abnormal; Notable for the following components:   Glucose, UA >=500 (*)    Hgb urine dipstick MODERATE (*)    Protein, ur 100 (*)    All other components within normal limits  TROPONIN I (HIGH SENSITIVITY) - Abnormal; Notable for the following components:   Troponin I (High Sensitivity) 28 (*)    All other components within normal limits    RESP PANEL BY RT-PCR (FLU A&B, COVID) [id number]  CULTURE, BLOOD (ROUTINE X 2)  CULTURE, BLOOD (ROUTINE X 2)  URINE CULTURE  AMMONIA  TSH  ETHANOL  RAPID URINE DRUG SCREEN, HOSP PERFORMED  LACTIC ACID, PLASMA  PROTIME-INR  TROPONIN I (HIGH SENSITIVITY)    EKG EKG Interpretation  Date/Time:  Saturday January 12 2020 09:03:48 EST Ventricular Rate:  89 PR Interval:    QRS Duration: 90 QT Interval:  363 QTC Calculation: 442 R Axis:   -7 Text Interpretation: Sinus rhythm no prior ECG for comparison. No STEMI Confirmed by Theda Belfast ([id number]) on 12/31/2019 9:28:43 AM   Radiology CT Head Wo Contrast  Result Date: 01/11/2020 CLINICAL DATA:  Pain following fall EXAM: CT HEAD WITHOUT CONTRAST CT CERVICAL SPINE WITHOUT CONTRAST TECHNIQUE: Multidetector CT imaging of the head and cervical spine was performed following the standard protocol without intravenous contrast. Multiplanar CT image reconstructions of the cervical spine were also generated. COMPARISON:  None. FINDINGS: CT HEAD FINDINGS Brain: There is extensive acute subdural hematoma seen on the left involving the left temporal, left frontal, and left parietal lobes. This subdural hematoma on the left is maximum in thickness in the temporal region with a maximum measured thickness of 1.7 cm. This subdural hematoma is causing 1.5 cm of midline shift toward the right. There is an apparent frontal contusion with hemorrhage and slight surrounding edema anteriorly measuring 1.6 x 1.5 cm. There is immediately adjacent subdural hemorrhage tracking along the falx. There is subarachnoid hemorrhage in the right temporoparietal junction region without appreciable mass effect. More inferiorly, there is a left tentorial subdural hematoma. There is subarachnoid hemorrhage in the left caudate nucleus tail region in adjacent inferomedial left temporal lobe which is causing mass effect on the uncus on the left. There is hemorrhage in the anterior superior  cerebellum which is felt to be subarachnoid with mild surrounding edema measuring 2.3 x 1.2 cm. There is also subarachnoid hemorrhage tracking from the lateral left midbrain into the upper pons region. Vascular: No hyperdense vessel. There is calcification in each carotid siphon region. Skull: There is a nondisplaced fracture of each anterior temporal bone. No depressed skull fracture evident. Sinuses/Orbits: Mucosal thickening noted in several ethmoid air cells. Orbits appear symmetric bilaterally. Other: Mastoid air cells are  clear. CT CERVICAL SPINE FINDINGS Alignment: There is cervical dextroscoliosis. No evidence spondylolisthesis. Skull base and vertebrae: Skull base and craniocervical junction regions unremarkable. There is pannus posterior to the odontoid which is not causing significant impression on craniocervical junction. No fracture is appreciable. No blastic or lytic bone lesions. Soft tissues and spinal canal: Prevertebral soft tissues and predental space regions are within normal limits. Note that the patient is intubated. No evident cord or canal hematoma. No paraspinous lesions. Disc levels: There is moderately severe disc space narrowing at C5-6. There is milder disc space narrowing at C3-4 and C4-5. There are prominent anterior and posterior osteophytes at C4, C5, and C6. There is a degree spinal stenosis at C4-5 due to central calcified osteophyte impressing on the ventral cord. There is spinal stenosis due to large central and right paracentral calcified spurring and disc protrusion at C5-6 with marked impression on the proximal C6 nerve root on the right due to bony hypertrophy and disc protrusion. Facet hypertrophy noted at several levels. Upper chest: Patient intubated. Mild scarring in the left upper lobe. No pneumothorax evident. Noah edema or consolidation. Other: Foci of calcification noted in each carotid artery. IMPRESSION: CT head: 1. Acute subdural hematoma in the left frontal, left  temporal, and left parietal regions measuring maximum thickness in the temporal region 1.7 cm. 1.5 cm of midline shift to the right at the lateral and third ventricular level. 2.  Falcine and left tentorial acute subdural hematomas. 3. Brain contusion with hemorrhage in the anterior superior frontal lobe, more severe on the left than on the right. This apparent contusion measures 1.6 x 1.5 cm. 4. Subarachnoid hemorrhage in the right temporoparietal region without mass effect. 5. Subarachnoid hemorrhage in the inferomedial left temporal lobe involving a portion of the tail of the caudate nucleus on the left causing mass effect on the left uncus. Subarachnoid hemorrhage seen in the lateral lower left midbrain extending into the left upper pons. Subarachnoid hemorrhage in the anterior upper right cerebellum measuring 2.3 x 1.2 cm with surrounding edema causing mass effect on the fourth ventricle. 6. Nondisplaced fractures of each anterior temporal bone. No displaced skull fractures. 7.  Foci of arterial vascular calcification. 8.  Mucosal thickening in several ethmoid air cells. CT cervical spine: 1.  No fracture.  No spondylolisthesis.  Scoliosis noted. 2. Multilevel arthropathy. Spinal stenosis due to bony hypertrophy centrally at C4-5 and centrally and toward the right at C5-6. Marked impression on the right C6 nerve root near its origin due to bony hypertrophy. 3. Foci of arterial vascular calcification involving each carotid artery. Critical Value/emergent results were called by telephone at the time of interpretation on 07-11-19 at 11:28 am to [facility] Even , who verbally acknowledged these results. Electronically Signed   By: Bretta BangWilliam  Woodruff III M.D.   On: 07-11-19 11:30   CT Cervical Spine Wo Contrast  Result Date: 07-11-19 CLINICAL DATA:  Pain following fall EXAM: CT HEAD WITHOUT CONTRAST CT CERVICAL SPINE WITHOUT CONTRAST TECHNIQUE: Multidetector CT imaging of the head and cervical  spine was performed following the standard protocol without intravenous contrast. Multiplanar CT image reconstructions of the cervical spine were also generated. COMPARISON:  None. FINDINGS: CT HEAD FINDINGS Brain: There is extensive acute subdural hematoma seen on the left involving the left temporal, left frontal, and left parietal lobes. This subdural hematoma on the left is maximum in thickness in the temporal region with a maximum measured thickness of 1.7 cm. This subdural hematoma is causing  1.5 cm of midline shift toward the right. There is an apparent frontal contusion with hemorrhage and slight surrounding edema anteriorly measuring 1.6 x 1.5 cm. There is immediately adjacent subdural hemorrhage tracking along the falx. There is subarachnoid hemorrhage in the right temporoparietal junction region without appreciable mass effect. More inferiorly, there is a left tentorial subdural hematoma. There is subarachnoid hemorrhage in the left caudate nucleus tail region in adjacent inferomedial left temporal lobe which is causing mass effect on the uncus on the left. There is hemorrhage in the anterior superior cerebellum which is felt to be subarachnoid with mild surrounding edema measuring 2.3 x 1.2 cm. There is also subarachnoid hemorrhage tracking from the lateral left midbrain into the upper pons region. Vascular: No hyperdense vessel. There is calcification in each carotid siphon region. Skull: There is a nondisplaced fracture of each anterior temporal bone. No depressed skull fracture evident. Sinuses/Orbits: Mucosal thickening noted in several ethmoid air cells. Orbits appear symmetric bilaterally. Other: Mastoid air cells are clear. CT CERVICAL SPINE FINDINGS Alignment: There is cervical dextroscoliosis. No evidence spondylolisthesis. Skull base and vertebrae: Skull base and craniocervical junction regions unremarkable. There is pannus posterior to the odontoid which is not causing significant impression  on craniocervical junction. No fracture is appreciable. No blastic or lytic bone lesions. Soft tissues and spinal canal: Prevertebral soft tissues and predental space regions are within normal limits. Note that the patient is intubated. No evident cord or canal hematoma. No paraspinous lesions. Disc levels: There is moderately severe disc space narrowing at C5-6. There is milder disc space narrowing at C3-4 and C4-5. There are prominent anterior and posterior osteophytes at C4, C5, and C6. There is a degree spinal stenosis at C4-5 due to central calcified osteophyte impressing on the ventral cord. There is spinal stenosis due to large central and right paracentral calcified spurring and disc protrusion at C5-6 with marked impression on the proximal C6 nerve root on the right due to bony hypertrophy and disc protrusion. Facet hypertrophy noted at several levels. Upper chest: Patient intubated. Mild scarring in the left upper lobe. No pneumothorax evident. Noah edema or consolidation. Other: Foci of calcification noted in each carotid artery. IMPRESSION: CT head: 1. Acute subdural hematoma in the left frontal, left temporal, and left parietal regions measuring maximum thickness in the temporal region 1.7 cm. 1.5 cm of midline shift to the right at the lateral and third ventricular level. 2.  Falcine and left tentorial acute subdural hematomas. 3. Brain contusion with hemorrhage in the anterior superior frontal lobe, more severe on the left than on the right. This apparent contusion measures 1.6 x 1.5 cm. 4. Subarachnoid hemorrhage in the right temporoparietal region without mass effect. 5. Subarachnoid hemorrhage in the inferomedial left temporal lobe involving a portion of the tail of the caudate nucleus on the left causing mass effect on the left uncus. Subarachnoid hemorrhage seen in the lateral lower left midbrain extending into the left upper pons. Subarachnoid hemorrhage in the anterior upper right cerebellum  measuring 2.3 x 1.2 cm with surrounding edema causing mass effect on the fourth ventricle. 6. Nondisplaced fractures of each anterior temporal bone. No displaced skull fractures. 7.  Foci of arterial vascular calcification. 8.  Mucosal thickening in several ethmoid air cells. CT cervical spine: 1.  No fracture.  No spondylolisthesis.  Scoliosis noted. 2. Multilevel arthropathy. Spinal stenosis due to bony hypertrophy centrally at C4-5 and centrally and toward the right at C5-6. Marked impression on the right C6 nerve  root near its origin due to bony hypertrophy. 3. Foci of arterial vascular calcification involving each carotid artery. Critical Value/emergent results were called by telephone at the time of interpretation on 2020/02/03 at 11:28 am to provider [facility] , who verbally acknowledged these results. Electronically Signed   By: Bretta Bang III M.D.   On: 2020/02/03 11:30   DG Chest Portable 1 View  Result Date: 02/03/2020 CLINICAL DATA:  Hypoxia.  Recent fall EXAM: PORTABLE CHEST 1 VIEW COMPARISON:  None. FINDINGS: Endotracheal tube tip is 7.9 cm above the carina. Nasogastric tube tip is below the diaphragm. The side port is immediately distal to the gastroesophageal junction. No pneumothorax. There is airspace opacity in left mid and lower lung regions as well as in the right lower lung region. Heart size normal. Pulmonary vascularity normal. There is dilatation of the aortic arch and proximal descending thoracic aorta. There is aortic atherosclerosis. No pneumothorax.  Degenerative change in each shoulder. IMPRESSION: 1.  Tube and catheter positions as described without pneumothorax. 2. Airspace opacity, likely multifocal pneumonia, seen in the left mid and lower lung regions as well as in the right lower lung region. 3. Enlargement of the aortic arch and proximal descending thoracic aorta consistent with aneurysmal dilatation. Underlying dissection cannot be excluded by radiography.  This finding may warrant contrast enhanced chest CT to further evaluate. Aortic Atherosclerosis (ICD10-I70.0). Electronically Signed   By: Bretta Bang III M.D.   On: February 03, 2020 10:05    Procedures Procedure Name: Intubation Date/Time: 02/03/2020 11:52 AM Performed by: Heide Scales, MD Pre-anesthesia Checklist: Patient identified, Emergency Drugs available, Suction available, Patient being monitored and Timeout performed Oxygen Delivery Method: Non-rebreather mask Preoxygenation: Pre-oxygenation with 100% oxygen Induction Type: Rapid sequence Laryngoscope Size: Glidescope Grade View: Grade I Tube size: 7.5 mm Number of attempts: 1 Placement Confirmation: ETT inserted through vocal cords under direct vision,  Positive ETCO2,  CO2 detector and Breath sounds checked- equal and bilateral Secured at: 21 cm Dental Injury: Teeth and Oropharynx as per pre-operative assessment       (including critical care time)  CRITICAL CARE Performed by: Canary Brim Ahmeer Tuman Total critical care time: 60 minutes Critical care time was exclusive of separately billable procedures and treating other patients. Critical care was necessary to treat or prevent imminent or life-threatening deterioration. Critical care was time spent personally by me on the following activities: development of treatment plan with patient and/or surrogate as well as nursing, discussions with consultants, evaluation of patient's response to treatment, examination of patient, obtaining history from patient or surrogate, ordering and performing treatments and interventions, ordering and review of laboratory studies, ordering and review of radiographic studies, pulse oximetry and re-evaluation of patient's condition.    Medications Ordered in ED Medications - No data to display  ED Course  I have reviewed the triage vital signs and the nursing notes.  Pertinent labs & imaging results that were available during my  care of the patient were reviewed by me and considered in my medical decision making (see chart for details).    MDM Rules/Calculators/A&P                          Stephane Mercer is a 86 y.o. female with past medical history significant for hypertension per the chart who presents for possible fall and ultimately status with unresponsiveness and hypoxia.  According to EMS, patient reportedly had a fall last night but is unclear the context.  Patient was found in her bed this morning unresponsive.  EMS got there and her oxygen saturations were in the 80s and she had very rhonchorous breath sounds and evidence of likely aspiration.  Therefore they suctioned vomit and fluid out of her mouth.  Patient had oxygen saturations in the 90s on a nonrebreather and she was brought in for evaluation.  On arrival, airway did not appear to be intact.  Patient's GCS was found to be 5 as she was only withdrawing minimally to pain in both legs but had no verbal response or eye response.  She did have a larger left pupil compared to right and is unclear if this is new or not however with possible fall or head injury I am concerned this could reflect intracranial hemorrhage.  Patient was intubated with RSI with etomidate and rocuronium without difficulty.  Postintubation x-ray is ordered.  Patient had work-up to look for aspiration, imaging of her head and neck, and screening labs.  We will test her for Covid.  According to EMS, patient is full code to their knowledge.  It is unclear of any other medical history.  Anticipate admission when work-up is further along.  10:17 AM My bedside review of the portable chest x-ray does show concern for a dilated aorta.  There is no prior to compare but I am concerned about aneurysm or dissection.  The intubation tube does appear to be in an appropriate position.  The read confirmed this as well as showed concern for pneumonia.  Given the hypoxia and unresponsiveness, she will go ahead  and cover with antibiotics.  We will add on the CT dissection study and once work-up is completed, will admit.  11:37 AM My review of the patient's head CT in the CT scanner is concerning for subdural hematoma.  Neurosurgery consult was placed.  I spoke to radiology who also confirmed subdural hematoma with midline shift as well as subarachnoid hemorrhage and contusion.  Neurosurgery was called and will come see the patient.  I spoke to the patient's husband and grandson who now are at the bedside.  They report they have never had conversations about end-of-life and patient is full code at this time.  We started Cleviprex with a goal of 140 systolic for blood pressure management and are waiting the rest of work-up.  Will call critical care for admission.  11:50 AM Just spoke to critical care who is going to come see the patient for further management.  Otherwise return in lactic acid is elevated 4.6.  She is getting the antibiotics for likely aspiration pneumonia.  Neurosurgery saw the patient and feel this is a nonsurvivable injury given the patient's age and comorbidities.  They recommend comfort management.  I spoke with family who agrees with comfort measures but would like her to stay intubated until some family can arrive from out of town.  I spoke with critical care who will admit and make a decision on when terminal/compassionate extubation will be performed.  Patient admitted for further management of her intracranial hemorrhages.   Final Clinical Impression(s) / ED Diagnoses Final diagnoses:  Subdural hematoma (HCC)  Subarachnoid bleed (HCC)  Unresponsive  Aspiration pneumonia, unspecified aspiration pneumonia type, unspecified laterality, unspecified part of lung (HCC)     Clinical Impression: 1. Subdural hematoma (HCC)   2. Subarachnoid bleed (HCC)   3. Unresponsive   4. Aspiration pneumonia, unspecified aspiration pneumonia type, unspecified laterality, unspecified part of  lung (HCC)     Disposition: Admit  This note was prepared with assistance of Conservation officer, historic buildings. Occasional wrong-word or sound-a-like substitutions may have occurred due to the inherent limitations of voice recognition software.     Promiss Labarbera, Canary Brim, MD 12/26/2019 [phone]

## 2020-01-12 NOTE — ED Triage Notes (Signed)
Patient presents to ed via [facility]  Patient was brought from assisted living, independent  Care, states female with patient states patient fell last pm and wasn't able to wake her up this am. Upon arrival patient is non'english speaking. States patient normally is able to talk and excise. Left pupil 5 and sluggish, right pupil is 3 and sluggish.

## 2020-01-12 NOTE — Progress Notes (Signed)
Pharmacy Antibiotic Note  Teresa Griffin is a 86 y.o. female admitted on 12/22/2019 with pneumonia and sepsis.  Pharmacy has been consulted for vancomycin and zosyn dosing. Pt is afebrile and SCr is elevated.   Plan: Vancomycin 1gm IV x 1 then 750mg  IV Q48H Zosyn 3.375gm IV x 1 then 2.25gm IV Q8H F/u renal fxn, C&S, clinical status and trough at SS  Height: 4\' 9"  (144.8 cm) Weight: 54.4 kg (120 lb) IBW/kg (Calculated) : 38.6  Temp (24hrs), Avg:98.5 F (36.9 C), Min:97.8 F (36.6 C), Max:99.5 F (37.5 C)  Recent Labs  Lab 12/18/2019 0903  CREATININE 1.51*    Estimated Creatinine Clearance: 17.2 mL/min (A) (by C-G formula based on SCr of 1.51 mg/dL (H)).    Not on File  Antimicrobials this admission: Vanc 11/27>> Zosyn 11/27>>  Dose adjustments this admission: N/A  Microbiology results: Pending  Thank you for allowing pharmacy to be a part of this patient's care.  Teresa Griffin, 01/02/2020 10:59 AM

## 2020-01-13 DIAGNOSIS — S06349A Traumatic hemorrhage of right cerebrum with loss of consciousness of unspecified duration, initial encounter: Secondary | ICD-10-CM | POA: Diagnosis not present

## 2020-01-13 DIAGNOSIS — S065X9A Traumatic subdural hemorrhage with loss of consciousness of unspecified duration, initial encounter: Secondary | ICD-10-CM | POA: Diagnosis not present

## 2020-01-13 LAB — URINE CULTURE: Culture: NO GROWTH

## 2020-01-13 MED ORDER — MORPHINE 100MG IN NS 100ML (1MG/ML) PREMIX INFUSION
1.0000 mg/h | INTRAVENOUS | Status: DC
  Administered 2020-01-13: 1 mg/h via INTRAVENOUS
  Administered 2020-01-14: 3 mg/h via INTRAVENOUS
  Filled 2020-01-13 (×2): qty 100

## 2020-01-13 NOTE — Progress Notes (Signed)
NAME:  Teresa Griffin, MRN:  [id number], DOB:  [birth year], LOS: 1 ADMISSION DATE:  01/26/2020, CONSULTATION DATE: 01/26/2020 REFERRING MD: Dr. Rush Landmark, CHIEF COMPLAINT: Intracranial hemorrhage  Brief History   86 year old with hypertension, hyperlipidemia who presented after a fall, found unresponsive at home.  CT head showed large left-sided subdural hematoma with midline shift, intraparenchymal hemorrhage and and subarachnoid hemorrhage. Neurosurgery recommend no surgical intervention  Past Medical History  Hypertension Hyperlipidemia  Significant Hospital Events     Consults:  PCCM Neurosurgery  Procedures:    Significant Diagnostic Tests:    Micro Data:    Antimicrobials:     Interim history/subjective:  Patient remained comfortable on mechanical ventilatory support  Objective   Blood pressure (!) 80/65, pulse (!) 110, temperature 99.1 F (37.3 C), temperature source Bladder, resp. rate (!) 22, height 4\' 9"  (1.448 m), weight 54.4 kg, SpO2 100 %.    Vent Mode: PRVC FiO2 (%):  [30 %-100 %] 30 % Set Rate:  [20 bmp] 20 bmp Vt Set:  [300 mL-400 mL] 300 mL PEEP:  [5 cmH20] 5 cmH20 Plateau Pressure:  [14 cmH20-18 cmH20] 14 cmH20   Intake/Output Summary (Last 24 hours) at 01/13/2020 1216 Last data filed at 01/13/2020 0600 Gross per 24 hour  Intake 31.94 ml  Output 695 ml  Net -663.06 ml   Filed Weights   01-26-2020 0938  Weight: 54.4 kg    Examination: General: Elderly female intubated on mechanical life support HENT: Small abrasion, left pupil fixed 4 mm, right pupil fixed 2 mm, sluggishly reactive Lungs: Bilateral ventilated breath sounds Cardiovascular: Regular rate rhythm S1-S2 Abdomen: Soft nontender nondistended  Resolved Hospital Problem list     Assessment & Plan:  Large left subdural hematoma with midline shift Right frontal lobe intracranial hemorrhage Right temporoparietal subarachnoid hemorrhage Acute hypoxemic respiratory failure requiring  intubation mechanical ventilation secondary hemorrhagic stroke  Continue comfort care Started on low-dose morphine infusion to alleviate pain Continue adult mechanical ventilator protocol No labs needed for follow-up. Spoke with patient's daughter who is in 01/14/20 at this time, she will be arriving on Thursday, if patient remains alive by that time, she will be palliatively extubated at that time   Best practice (evaluated daily)   Diet: N.p.o. Pain/Anxiety/Delirium protocol (if indicated): IV morphine VAP protocol (if indicated): Not applicable DVT prophylaxis: Unable to give GI prophylaxis: Not applicable Glucose control: Not applicable, comfort measures Mobility: Bedrest last date of multidisciplinary goals of care discussion 01/26/2020 Family and staff present myself, ER nurse, patient's husband Summary of discussion plan for admission to the hospital for comfort care transition Code Status: DNR Disposition: ICU with plans for comfort care transition once family present.  Labs   CBC: Recent Labs  Lab 01-26-20 0903  WBC 4.7  NEUTROABS 4.0  HGB 10.4*  HCT 32.2*  MCV 97.0  PLT 116*    Basic Metabolic Panel: Recent Labs  Lab 01/26/20 0903  NA 138  K 4.1  CL 106  CO2 17*  GLUCOSE 215*  BUN 30*  CREATININE 1.51*  CALCIUM 9.9   GFR: Estimated Creatinine Clearance: 17.2 mL/min (A) (by C-G formula based on SCr of 1.51 mg/dL (H)). Recent Labs  Lab 01/26/20 0903 01/26/2020 1201  WBC 4.7  --   LATICACIDVEN 4.6* 6.8*    Liver Function Tests: Recent Labs  Lab Jan 26, 2020 0903  AST 20  ALT 13  ALKPHOS 39  BILITOT 0.7  PROT 7.3  ALBUMIN 3.6   Recent Labs  Lab 26-Jan-2020 [phone]  LIPASE 57*   Recent Labs  Lab 12/17/2019 1018  AMMONIA 17    ABG No results found for: PHART, PCO2ART, PO2ART, HCO3, TCO2, ACIDBASEDEF, O2SAT   Coagulation Profile: No results for input(s): INR, PROTIME in the last 168 hours.  Cardiac Enzymes: No results for input(s): CKTOTAL,  CKMB, CKMBINDEX, TROPONINI in the last 168 hours.  HbA1C: No results found for: HGBA1C  CBG: No results for input(s): GLUCAP in the last 168 hours.  Review of Systems:    Past Medical History  She,  has no past medical history on file.  Hypertension hyperlipidemia  Surgical History   History reviewed. No pertinent surgical history.   Social History      Family History   Her family history is not on file.   Allergies No Known Allergies   Home Medications  Prior to Admission medications   Medication Sig Start Date End Date Taking? Authorizing Provider  fluticasone Aleda Grana) 50 MCG/ACT nasal spray  11/23/19   [provider]  folic acid (FOLVITE) 800 MCG tablet  09/10/19   [provider]  metoprolol succinate (TOPROL-XL) 100 MG 24 hr tablet  11/21/19   [provider]  olmesartan (BENICAR) 20 MG tablet  11/21/19   [provider]  omeprazole (PRILOSEC) 40 MG capsule Take 40 mg by mouth daily. 11/16/19   [provider]  potassium chloride (MICRO-K) 10 MEQ CR capsule  11/30/19   [provider]  simvastatin (ZOCOR) 20 MG tablet Take 20 mg by mouth at bedtime. 12/24/19   [provider]  triamcinolone (KENALOG) 0.1 %  12/21/19   [provider]      Cheri Fowler MD  Pulmonary Critical Care Pager: [phone] Mobile: [phone]

## 2020-01-14 ENCOUNTER — Other Ambulatory Visit: Payer: Self-pay

## 2020-01-14 DIAGNOSIS — S065X9A Traumatic subdural hemorrhage with loss of consciousness of unspecified duration, initial encounter: Secondary | ICD-10-CM | POA: Diagnosis not present

## 2020-01-14 MED ORDER — SODIUM CHLORIDE 0.9 % IV SOLN
INTRAVENOUS | Status: DC | PRN
  Administered 2020-01-14: 250 mL via INTRAVENOUS

## 2020-01-14 MED ORDER — LACTATED RINGERS IV BOLUS
500.0000 mL | Freq: Once | INTRAVENOUS | Status: AC
  Administered 2020-01-14: 500 mL via INTRAVENOUS

## 2020-01-14 NOTE — TOC CAGE-AID Note (Signed)
Transition of Care [facility]) - CAGE-AID Screening   Patient Details  Name: Teresa Griffin MRN: [id number] Date of Birth: 10-23-1926  Transition of [facility]) CM/SW Contact:    Jimmy Picket, [place] Phone Number: 01/14/2020, 4:49 PM   Clinical Narrative:  Pt is on ventilator and unable to participate in assessment at this time.   CAGE-AID Screening: Substance Abuse Screening unable to be completed due to: : Patient unable to participate                  Isabella Stalling Clinical Social Worker [phone]

## 2020-01-14 NOTE — Progress Notes (Signed)
Removed patient's wedding ring from left ring finger and gave it to Husband.

## 2020-01-14 NOTE — Progress Notes (Signed)
NAME:  Teresa Griffin, MRN:  [id number], DOB:  1926-02-23, LOS: 2 ADMISSION DATE:  01/02/2020, CONSULTATION DATE: 12/20/2019 REFERRING MD: Dr. Rush Landmark, CHIEF COMPLAINT: Intracranial hemorrhage  Brief History   86 year old with hypertension, hyperlipidemia who presented after a fall, found unresponsive at home.  CT head showed large left-sided subdural hematoma with midline shift, intraparenchymal hemorrhage and and subarachnoid hemorrhage. Neurosurgery recommend no surgical intervention  Past Medical History  Hypertension Hyperlipidemia  Significant Hospital Events   11/27 Admit to ICU  Consults:  PCCM Neurosurgery  Procedures:    Significant Diagnostic Tests:    Micro Data:    Antimicrobials:     Interim history/subjective:  Patient remains on mechanical ventilation, no sedation.  No labs. Morphine drip infusion, hypotensive since yesterday  Objective   Blood pressure (!) 60/43, pulse 95, temperature 98.1 F (36.7 C), resp. rate 19, height 4\' 9"  (1.448 m), weight 54.4 kg, SpO2 100 %.    Vent Mode: PRVC FiO2 (%):  [30 %] 30 % Set Rate:  [20 bmp] 20 bmp Vt Set:  [300 mL] 300 mL PEEP:  [5 cmH20] 5 cmH20 Plateau Pressure:  [12 cmH20-15 cmH20] 15 cmH20   Intake/Output Summary (Last 24 hours) at 01/14/2020 0816 Last data filed at 01/14/2020 0600 Gross per 24 hour  Intake 49.14 ml  Output 205 ml  Net -155.86 ml   Filed Weights   12/18/2019 0938  Weight: 54.4 kg    Examination: General: Elderly female intubated on mechanical life support without sedation HENT: Small abrasion,bilateral pupils fixed at 3 mm Lungs: Bilateral ventilated breath sounds, no secretions via ETT. Cardiovascular: Regular rate rhythm S1-S2, no edema, warm.   Abdomen: Soft nontender nondistended Neuro: pupils fixed, no corneal, no cough/gag or extremity movement. + spontaneous respirations.   Resolved Hospital Problem list     Assessment & Plan:  Large left subdural hematoma with midline  shift Right frontal lobe intracranial hemorrhage Right temporoparietal subarachnoid hemorrhage Acute hypoxemic respiratory failure requiring intubation mechanical ventilation secondary hemorrhagic stroke  Comfort directed care with morphine drip, plans for palliative extubation once daughter arrives this evening. Will give 500 ml bolus with persistent hypotension and plans for family arrival today Continue adult mechanical ventilator protocol   Best practice (evaluated daily)   Diet: N.p.o. Pain/Anxiety/Delirium protocol (if indicated): IV morphine VAP protocol (if indicated): Not applicable DVT prophylaxis: Contraindicated with LSDH/ICH GI prophylaxis: Not applicable Glucose control: Not applicable, comfort measures Mobility: Bedrest Code Status: DNR Disposition: ICU  Labs   CBC: Recent Labs  Lab 01/06/2020 0903  WBC 4.7  NEUTROABS 4.0  HGB 10.4*  HCT 32.2*  MCV 97.0  PLT 116*    Basic Metabolic Panel: Recent Labs  Lab 12/19/2019 0903  NA 138  K 4.1  CL 106  CO2 17*  GLUCOSE 215*  BUN 30*  CREATININE 1.51*  CALCIUM 9.9   GFR: Estimated Creatinine Clearance: 17.2 mL/min (A) (by C-G formula based on SCr of 1.51 mg/dL (H)). Recent Labs  Lab 12/24/2019 0903 01/08/2020 1201  WBC 4.7  --   LATICACIDVEN 4.6* 6.8*    Liver Function Tests: Recent Labs  Lab 01/01/2020 0903  AST 20  ALT 13  ALKPHOS 39  BILITOT 0.7  PROT 7.3  ALBUMIN 3.6   Recent Labs  Lab 01/01/2020 0903  LIPASE 57*   Recent Labs  Lab 12/31/2019 1018  AMMONIA 17    ABG No results found for: PHART, PCO2ART, PO2ART, HCO3, TCO2, ACIDBASEDEF, O2SAT   Coagulation Profile: No results for input(s):  INR, PROTIME in the last 168 hours.  Cardiac Enzymes: No results for input(s): CKTOTAL, CKMB, CKMBINDEX, TROPONINI in the last 168 hours.  HbA1C: No results found for: HGBA1C  CBG: No results for input(s): GLUCAP in the last 168 hours.   Earney Hamburg, ACNP [place] [facility]  [facility]

## 2020-01-15 DIAGNOSIS — S065X9A Traumatic subdural hemorrhage with loss of consciousness of unspecified duration, initial encounter: Secondary | ICD-10-CM | POA: Diagnosis not present

## 2020-01-15 MED ORDER — MORPHINE 100MG IN NS 100ML (1MG/ML) PREMIX INFUSION
0.0000 mg/h | INTRAVENOUS | Status: DC
  Administered 2020-01-16: 10 mg/h via INTRAVENOUS
  Filled 2020-01-15: qty 100

## 2020-01-15 MED ORDER — ACETAMINOPHEN 325 MG PO TABS: 650.0000 mg | ORAL_TABLET | Freq: Four times a day (QID) | ORAL | Status: DC | PRN

## 2020-01-15 MED ORDER — DIPHENHYDRAMINE HCL 50 MG/ML IJ SOLN: 25.0000 mg | INTRAMUSCULAR | Status: DC | PRN

## 2020-01-15 MED ORDER — GLYCOPYRROLATE 0.2 MG/ML IJ SOLN: 0.2000 mg | INTRAMUSCULAR | Status: DC | PRN

## 2020-01-15 MED ORDER — GLYCOPYRROLATE 1 MG PO TABS: 1.0000 mg | ORAL_TABLET | ORAL | Status: DC | PRN

## 2020-01-15 MED ORDER — DEXTROSE 5 % IV SOLN: INTRAVENOUS | Status: DC

## 2020-01-15 MED ORDER — MORPHINE SULFATE (PF) 2 MG/ML IV SOLN: 2.0000 mg | INTRAVENOUS | Status: DC | PRN

## 2020-01-15 MED ORDER — POLYVINYL ALCOHOL 1.4 % OP SOLN: 1.0000 [drp] | Freq: Four times a day (QID) | OPHTHALMIC | Status: DC | PRN

## 2020-01-15 MED ORDER — ACETAMINOPHEN 650 MG RE SUPP: 650.0000 mg | Freq: Four times a day (QID) | RECTAL | Status: DC | PRN

## 2020-01-15 MED ORDER — MORPHINE BOLUS VIA INFUSION
5.0000 mg | INTRAVENOUS | Status: DC | PRN
  Filled 2020-01-15: qty 5

## 2020-01-15 MED FILL — Rocuronium Bromide IV Soln 50 MG/5ML (10 MG/ML): INTRAVENOUS | Qty: 5 | Status: AC

## 2020-01-15 NOTE — Progress Notes (Signed)
eLink Physician-Brief Progress Note Patient Name: Teresa Griffin DOB: [birth year] MRN: [id number]   Date of Service  01/15/2020  HPI/Events of Note  Family has arrived at bedside. Spoke with patient's son, Teresa Griffin, who confirms that given his mother poor prognosis that the family wishes to proceed with comfort care, remove life-sustaining therapy and allow his mother to pass with comfort and dignity.   eICU Interventions  Will proceed with comfort care via the PCCM removal of life-sustaining care order set protocol.      Intervention Category Major Interventions: End of life / care limitation discussion  Lenell Antu 01/15/2020, 11:35 PM

## 2020-01-15 NOTE — Progress Notes (Signed)
NAME:  Teresa Griffin, MRN:  [id number], DOB:  24-Nov-1926, LOS: 3 ADMISSION DATE:  02-11-20, CONSULTATION DATE: 11-Feb-2020 REFERRING MD: Dr. Rush Landmark, CHIEF COMPLAINT: Intracranial hemorrhage  Brief History   86 year old with hypertension, hyperlipidemia who presented after a fall, found unresponsive at home.  CT head showed large left-sided subdural hematoma with midline shift, intraparenchymal hemorrhage and and subarachnoid hemorrhage. Neurosurgery recommend no surgical intervention  Past Medical History  Hypertension Hyperlipidemia  Significant Hospital Events   11/27 Admit to ICU  Consults:  PCCM Neurosurgery  Procedures:    Significant Diagnostic Tests:    Micro Data:    Antimicrobials:     Interim history/subjective:  Patient remains on mechanical ventilation, morphine infusion.  Objective   Blood pressure (!) 65/41, pulse 95, temperature (!) 97.3 F (36.3 C), resp. rate 20, height 4\' 9"  (1.448 m), weight 54.4 kg, SpO2 100 %.    Vent Mode: PRVC FiO2 (%):  [30 %] 30 % Set Rate:  [20 bmp] 20 bmp Vt Set:  [300 mL] 300 mL PEEP:  [5 cmH20] 5 cmH20 Plateau Pressure:  [8 cmH20-11 cmH20] 8 cmH20   Intake/Output Summary (Last 24 hours) at 01/15/2020 1731 Last data filed at 01/15/2020 1500 Gross per 24 hour  Intake 285.94 ml  Output 17 ml  Net 268.94 ml   Filed Weights   02/11/20 0938  Weight: 54.4 kg    Examination: General: Elderly female intubated on mechanical life support without sedation HENT: Small abrasion,bilateral pupils fixed at 3 mm Lungs: Bilateral ventilated breath sounds, no secretions via ETT. Cardiovascular: Regular rate rhythm S1-S2, no edema, warm.   Abdomen: Soft nontender nondistended Neuro: pupils fixed, no corneal, no cough/gag or extremity movement. + spontaneous respirations.   Resolved Hospital Problem list     Assessment & Plan:  Large left subdural hematoma with midline shift Right frontal lobe intracranial  hemorrhage Right temporoparietal subarachnoid hemorrhage Acute hypoxemic respiratory failure requiring intubation mechanical ventilation secondary hemorrhagic stroke  Comfort directed care with morphine drip, plans for palliative extubation once daughter arrives    Best practice (evaluated daily)   Diet: N.p.o. Pain/Anxiety/Delirium protocol (if indicated): IV morphine VAP protocol (if indicated): Not applicable DVT prophylaxis: Contraindicated with LSDH/ICH GI prophylaxis: Not applicable Glucose control: Not applicable, comfort measures Mobility: Bedrest Code Status: DNR Disposition: ICU  Labs   CBC: Recent Labs  Lab 11-Feb-2020 0903  WBC 4.7  NEUTROABS 4.0  HGB 10.4*  HCT 32.2*  MCV 97.0  PLT 116*    Basic Metabolic Panel: Recent Labs  Lab 02-11-2020 0903  NA 138  K 4.1  CL 106  CO2 17*  GLUCOSE 215*  BUN 30*  CREATININE 1.51*  CALCIUM 9.9   GFR: Estimated Creatinine Clearance: 17.2 mL/min (A) (by C-G formula based on SCr of 1.51 mg/dL (H)). Recent Labs  Lab 02/11/20 0903 02-11-20 1201  WBC 4.7  --   LATICACIDVEN 4.6* 6.8*    Liver Function Tests: Recent Labs  Lab February 11, 2020 0903  AST 20  ALT 13  ALKPHOS 39  BILITOT 0.7  PROT 7.3  ALBUMIN 3.6   Recent Labs  Lab 2020/02/11 0903  LIPASE 57*   Recent Labs  Lab February 11, 2020 1018  AMMONIA 17    ABG No results found for: PHART, PCO2ART, PO2ART, HCO3, TCO2, ACIDBASEDEF, O2SAT   Coagulation Profile: No results for input(s): INR, PROTIME in the last 168 hours.  Cardiac Enzymes: No results for input(s): CKTOTAL, CKMB, CKMBINDEX, TROPONINI in the last 168 hours.  HbA1C: No results found  for: HGBA1C  CBG: No results for input(s): GLUCAP in the last 168 hours.   Lynnell Catalan, MD [facility] ICU Physician [facility] Critical [facility]  Pager: [phone] Or Epic Secure Chat After hours: [phone].  01/15/2020, 5:32 PM

## 2020-01-15 NOTE — Procedures (Signed)
Extubation Procedure Note  Patient Details:   Name: Teresa Griffin DOB: 1926-03-22 MRN: [id number]   Airway Documentation:  Airway 7.5 mm (Active)  Secured at (cm) 20 cm 01/15/20 2018  Measured From Lips 01/15/20 2018  Secured Location Right 01/15/20 2018  Secured By [employer] 01/15/20 2018  Tube Holder Repositioned Yes 01/15/20 2018  Prone position No 01/15/20 2018  Cuff Pressure (cm H2O) 30 cm H2O 01/14/20 2016  Site Condition Dry 01/15/20 2018   Vent end date: (not recorded) Vent end time: (not recorded)   Evaluation  O2 sats: currently acceptable Complications: No apparent complications Patient did tolerate procedure well. Bilateral Breath Sounds: Clear, Diminished   No  Pt terminally extubated per order with family at bedside.  Letitia Caul [facility] 01/15/2020, 11:57 PM

## 2020-01-16 ENCOUNTER — Encounter (HOSPITAL_COMMUNITY): Payer: Self-pay | Admitting: Emergency Medicine

## 2020-01-16 DEATH — deceased

## 2020-01-17 LAB — CULTURE, BLOOD (ROUTINE X 2)
Culture: NO GROWTH
Culture: NO GROWTH

## 2020-02-16 NOTE — Death Summary Note (Signed)
DEATH SUMMARY   Patient Details  Name: Teresa Griffin MRN: [id number] DOB: March 22, 1926  Admission/Discharge Information   Admit Date:  16-Jan-2020  Date of Death: Date of Death: 01-20-20  Time of Death: Time of Death: 0229  Length of Stay: 4  Referring Physician: Salli Real, MD   Reason(s) for Hospitalization  Intracranial hemorrhage  Diagnoses  Preliminary cause of death: Intracranial hemorrhage Secondary Diagnoses (including complications and co-morbidities):  Active Problems:   ICH (intracerebral hemorrhage) [facility])   Brief Hospital Course (including significant findings, care, treatment, and services provided and events leading to death)  Teresa Griffin is a 87 y.o. year old female who suffered a fall at home and was found unresponsive.  CT showed large subdural hematoma with midline shift and intracranial hemorrhage.  Deemed to be irreversible injury.  Decision was made to transition to comfort care which occurred once the family arrived from out of town.    Pertinent Labs and Studies  Significant Diagnostic Studies CT Head Wo Contrast  Result Date: 02/06/2020 CLINICAL DATA:  Pain following fall EXAM: CT HEAD WITHOUT CONTRAST CT CERVICAL SPINE WITHOUT CONTRAST TECHNIQUE: Multidetector CT imaging of the head and cervical spine was performed following the standard protocol without intravenous contrast. Multiplanar CT image reconstructions of the cervical spine were also generated. COMPARISON:  None. FINDINGS: CT HEAD FINDINGS Brain: There is extensive acute subdural hematoma seen on the left involving the left temporal, left frontal, and left parietal lobes. This subdural hematoma on the left is maximum in thickness in the temporal region with a maximum measured thickness of 1.7 cm. This subdural hematoma is causing 1.5 cm of midline shift toward the right. There is an apparent frontal contusion with hemorrhage and slight surrounding edema anteriorly measuring 1.6 x 1.5 cm. There is  immediately adjacent subdural hemorrhage tracking along the falx. There is subarachnoid hemorrhage in the right temporoparietal junction region without appreciable mass effect. More inferiorly, there is a left tentorial subdural hematoma. There is subarachnoid hemorrhage in the left caudate nucleus tail region in adjacent inferomedial left temporal lobe which is causing mass effect on the uncus on the left. There is hemorrhage in the anterior superior cerebellum which is felt to be subarachnoid with mild surrounding edema measuring 2.3 x 1.2 cm. There is also subarachnoid hemorrhage tracking from the lateral left midbrain into the upper pons region. Vascular: No hyperdense vessel. There is calcification in each carotid siphon region. Skull: There is a nondisplaced fracture of each anterior temporal bone. No depressed skull fracture evident. Sinuses/Orbits: Mucosal thickening noted in several ethmoid air cells. Orbits appear symmetric bilaterally. Other: Mastoid air cells are clear. CT CERVICAL SPINE FINDINGS Alignment: There is cervical dextroscoliosis. No evidence spondylolisthesis. Skull base and vertebrae: Skull base and craniocervical junction regions unremarkable. There is pannus posterior to the odontoid which is not causing significant impression on craniocervical junction. No fracture is appreciable. No blastic or lytic bone lesions. Soft tissues and spinal canal: Prevertebral soft tissues and predental space regions are within normal limits. Note that the patient is intubated. No evident cord or canal hematoma. No paraspinous lesions. Disc levels: There is moderately severe disc space narrowing at C5-6. There is milder disc space narrowing at C3-4 and C4-5. There are prominent anterior and posterior osteophytes at C4, C5, and C6. There is a degree spinal stenosis at C4-5 due to central calcified osteophyte impressing on the ventral cord. There is spinal stenosis due to large central and right paracentral  calcified spurring and disc protrusion at  C5-6 with marked impression on the proximal C6 nerve root on the right due to bony hypertrophy and disc protrusion. Facet hypertrophy noted at several levels. Upper chest: Patient intubated. Mild scarring in the left upper lobe. No pneumothorax evident. Noah edema or consolidation. Other: Foci of calcification noted in each carotid artery. IMPRESSION: CT head: 1. Acute subdural hematoma in the left frontal, left temporal, and left parietal regions measuring maximum thickness in the temporal region 1.7 cm. 1.5 cm of midline shift to the right at the lateral and third ventricular level. 2.  Falcine and left tentorial acute subdural hematomas. 3. Brain contusion with hemorrhage in the anterior superior frontal lobe, more severe on the left than on the right. This apparent contusion measures 1.6 x 1.5 cm. 4. Subarachnoid hemorrhage in the right temporoparietal region without mass effect. 5. Subarachnoid hemorrhage in the inferomedial left temporal lobe involving a portion of the tail of the caudate nucleus on the left causing mass effect on the left uncus. Subarachnoid hemorrhage seen in the lateral lower left midbrain extending into the left upper pons. Subarachnoid hemorrhage in the anterior upper right cerebellum measuring 2.3 x 1.2 cm with surrounding edema causing mass effect on the fourth ventricle. 6. Nondisplaced fractures of each anterior temporal bone. No displaced skull fractures. 7.  Foci of arterial vascular calcification. 8.  Mucosal thickening in several ethmoid air cells. CT cervical spine: 1.  No fracture.  No spondylolisthesis.  Scoliosis noted. 2. Multilevel arthropathy. Spinal stenosis due to bony hypertrophy centrally at C4-5 and centrally and toward the right at C5-6. Marked impression on the right C6 nerve root near its origin due to bony hypertrophy. 3. Foci of arterial vascular calcification involving each carotid artery. Critical Value/emergent results  were called by telephone at the time of interpretation on 12/25/2019 at 11:28 am to provider [facility] , who verbally acknowledged these results. Electronically Signed   By: Bretta Bang III M.D.   On: 12/21/2019 11:30   CT Cervical Spine Wo Contrast  Result Date: 12/31/2019 CLINICAL DATA:  Pain following fall EXAM: CT HEAD WITHOUT CONTRAST CT CERVICAL SPINE WITHOUT CONTRAST TECHNIQUE: Multidetector CT imaging of the head and cervical spine was performed following the standard protocol without intravenous contrast. Multiplanar CT image reconstructions of the cervical spine were also generated. COMPARISON:  None. FINDINGS: CT HEAD FINDINGS Brain: There is extensive acute subdural hematoma seen on the left involving the left temporal, left frontal, and left parietal lobes. This subdural hematoma on the left is maximum in thickness in the temporal region with a maximum measured thickness of 1.7 cm. This subdural hematoma is causing 1.5 cm of midline shift toward the right. There is an apparent frontal contusion with hemorrhage and slight surrounding edema anteriorly measuring 1.6 x 1.5 cm. There is immediately adjacent subdural hemorrhage tracking along the falx. There is subarachnoid hemorrhage in the right temporoparietal junction region without appreciable mass effect. More inferiorly, there is a left tentorial subdural hematoma. There is subarachnoid hemorrhage in the left caudate nucleus tail region in adjacent inferomedial left temporal lobe which is causing mass effect on the uncus on the left. There is hemorrhage in the anterior superior cerebellum which is felt to be subarachnoid with mild surrounding edema measuring 2.3 x 1.2 cm. There is also subarachnoid hemorrhage tracking from the lateral left midbrain into the upper pons region. Vascular: No hyperdense vessel. There is calcification in each carotid siphon region. Skull: There is a nondisplaced fracture of each anterior temporal bone.  No depressed skull fracture evident. Sinuses/Orbits: Mucosal thickening noted in several ethmoid air cells. Orbits appear symmetric bilaterally. Other: Mastoid air cells are clear. CT CERVICAL SPINE FINDINGS Alignment: There is cervical dextroscoliosis. No evidence spondylolisthesis. Skull base and vertebrae: Skull base and craniocervical junction regions unremarkable. There is pannus posterior to the odontoid which is not causing significant impression on craniocervical junction. No fracture is appreciable. No blastic or lytic bone lesions. Soft tissues and spinal canal: Prevertebral soft tissues and predental space regions are within normal limits. Note that the patient is intubated. No evident cord or canal hematoma. No paraspinous lesions. Disc levels: There is moderately severe disc space narrowing at C5-6. There is milder disc space narrowing at C3-4 and C4-5. There are prominent anterior and posterior osteophytes at C4, C5, and C6. There is a degree spinal stenosis at C4-5 due to central calcified osteophyte impressing on the ventral cord. There is spinal stenosis due to large central and right paracentral calcified spurring and disc protrusion at C5-6 with marked impression on the proximal C6 nerve root on the right due to bony hypertrophy and disc protrusion. Facet hypertrophy noted at several levels. Upper chest: Patient intubated. Mild scarring in the left upper lobe. No pneumothorax evident. Noah edema or consolidation. Other: Foci of calcification noted in each carotid artery. IMPRESSION: CT head: 1. Acute subdural hematoma in the left frontal, left temporal, and left parietal regions measuring maximum thickness in the temporal region 1.7 cm. 1.5 cm of midline shift to the right at the lateral and third ventricular level. 2.  Falcine and left tentorial acute subdural hematomas. 3. Brain contusion with hemorrhage in the anterior superior frontal lobe, more severe on the left than on the right. This  apparent contusion measures 1.6 x 1.5 cm. 4. Subarachnoid hemorrhage in the right temporoparietal region without mass effect. 5. Subarachnoid hemorrhage in the inferomedial left temporal lobe involving a portion of the tail of the caudate nucleus on the left causing mass effect on the left uncus. Subarachnoid hemorrhage seen in the lateral lower left midbrain extending into the left upper pons. Subarachnoid hemorrhage in the anterior upper right cerebellum measuring 2.3 x 1.2 cm with surrounding edema causing mass effect on the fourth ventricle. 6. Nondisplaced fractures of each anterior temporal bone. No displaced skull fractures. 7.  Foci of arterial vascular calcification. 8.  Mucosal thickening in several ethmoid air cells. CT cervical spine: 1.  No fracture.  No spondylolisthesis.  Scoliosis noted. 2. Multilevel arthropathy. Spinal stenosis due to bony hypertrophy centrally at C4-5 and centrally and toward the right at C5-6. Marked impression on the right C6 nerve root near its origin due to bony hypertrophy. 3. Foci of arterial vascular calcification involving each carotid artery. Critical Value/emergent results were called by telephone at the time of interpretation on 02-04-2020 at 11:28 am to provider [facility] , who verbally acknowledged these results. Electronically Signed   By: Bretta Bang III M.D.   On: 02/04/20 11:30   DG Chest Portable 1 View  Result Date: 2020-02-04 CLINICAL DATA:  Hypoxia.  Recent fall EXAM: PORTABLE CHEST 1 VIEW COMPARISON:  None. FINDINGS: Endotracheal tube tip is 7.9 cm above the carina. Nasogastric tube tip is below the diaphragm. The side port is immediately distal to the gastroesophageal junction. No pneumothorax. There is airspace opacity in left mid and lower lung regions as well as in the right lower lung region. Heart size normal. Pulmonary vascularity normal. There is dilatation of the aortic arch and proximal  descending thoracic aorta. There is  aortic atherosclerosis. No pneumothorax.  Degenerative change in each shoulder. IMPRESSION: 1.  Tube and catheter positions as described without pneumothorax. 2. Airspace opacity, likely multifocal pneumonia, seen in the left mid and lower lung regions as well as in the right lower lung region. 3. Enlargement of the aortic arch and proximal descending thoracic aorta consistent with aneurysmal dilatation. Underlying dissection cannot be excluded by radiography. This finding may warrant contrast enhanced chest CT to further evaluate. Aortic Atherosclerosis (ICD10-I70.0). Electronically Signed   By: Bretta Bang III M.D.   On: 2020/01/18 10:05   CT Angio Chest/Abd/Pel for Dissection W and/or Wo Contrast  Result Date: 01/18/20 CLINICAL DATA:  87 year old female found unresponsive, concern for aortic aneurysm on chest radiograph. EXAM: CT ANGIOGRAPHY CHEST, ABDOMEN AND PELVIS TECHNIQUE: Non-contrast CT of the chest was initially obtained. Multidetector CT imaging through the chest, abdomen and pelvis was performed using the standard protocol during bolus administration of intravenous contrast. Multiplanar reconstructed images and MIPs were obtained and reviewed to evaluate the vascular anatomy. CONTRAST:  29mL OMNIPAQUE IOHEXOL 350 MG/ML SOLN COMPARISON:  Chest radiograph from earlier the same day FINDINGS: CTA CHEST FINDINGS Cardiovascular: Normal caliber sinuses of Valsalva measuring 25 x 28 x 24 mm with trileaflet aortic valve. The sino-tubular junction measures 27 mm. The ascending thoracic aorta measures up to 32 mm. The aortic arch measures up to 32 mm. Conventional branching pattern without flow-limiting stenosis or aneurysm. In the proximal descending thoracic aorta there is a large, fusiform and partially thrombosed aneurysm measuring 71 x 60 by 57 mm. The distal thoracic aorta is normal in caliber measuring 22 mm just inferior to the level of the carina. Scattered atherosclerotic calcifications in  the descending thoracic aorta. The heart is normal in size. Severe coronary atherosclerotic calcifications. No pericardial effusion. Mediastinum/Nodes: No enlarged mediastinal, hilar, or axillary lymph nodes. Thyroid gland, trachea, and esophagus demonstrate no significant findings. Enteric tube courses through the esophagus. Lungs/Pleura: Endotracheal tube in place with the distal tip in the superior thoracic trachea. Nonobstructive layering secretions within the distal trachea and left mainstem bronchus. Multifocal consolidative opacities in the dependent portions of the left lower and left upper lobe with air bronchograms. Similar faint but smaller opacifications in the perifissural right lower lobe. Musculoskeletal: Ill-defined sclerotic foci in the anterior aspect of the T1 vertebral body (measuring 1 cm) and in the medial aspect of the right seventh rib (measuring 0.9 cm), favored represent benign etiology such as bone islands. Multilevel degenerative changes of the thoracic spine. No acute osseous abnormality. Review of the MIP images confirms the above findings. CTA ABDOMEN AND PELVIS FINDINGS VASCULAR Aorta: Normal caliber, patent. Scattered atherosclerotic calcifications. Celiac: Patent without evidence of aneurysm, dissection, vasculitis or significant stenosis. SMA: Mild ostial stenosis secondary to atherosclerotic plaque. Otherwise widely patent. Renals: Single right and dual left renal arteries are patent without evidence of aneurysm, dissection, vasculitis, fibromuscular dysplasia or significant stenosis. IMA: Patent without evidence of aneurysm, dissection, vasculitis or significant stenosis. Inflow: Patent without evidence of aneurysm, dissection, vasculitis or significant stenosis. Scattered atherosclerotic calcification. Veins: No obvious venous abnormality within the limitations of this arterial phase study. Review of the MIP images confirms the above findings. NON-VASCULAR Hepatobiliary: No  focal liver abnormality is seen. Status post cholecystectomy. No abnormal biliary dilatation. Pancreas: Unremarkable. No pancreatic ductal dilatation or surrounding inflammatory changes. Spleen: Normal in size without focal abnormality. Adrenals/Urinary Tract: Adrenal glands are unremarkable. Kidneys are normal, without renal calculi, focal lesion, or  hydronephrosis. Bladder partially decompressed with Foley catheter in place. Stomach/Bowel: Gastric decompression tube with tip in the gastric body. Stomach is within normal limits. Appendix appears normal. No evidence of bowel wall thickening, distention, or inflammatory changes. Lymphatic: No abdominopelvic lymphadenopathy. Reproductive: Retroflexed uterus with multiple internal calcifications. No adnexal mass. Other: No abdominal wall hernia or abnormality. No abdominopelvic ascites. Musculoskeletal: No acute or significant osseous findings. Review of the MIP images confirms the above findings. IMPRESSION: VASCULAR: 1. 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 Cardiothoracic surgery consultation recommended due to increased risk of rupture for arch aneurysm ? 5.5 cm. This recommendation follows 2010 ACCF/AHA/AATS/ACR/ASA/SCA/SCAI/SIR/STS/SVM Guidelines for the Diagnosis and Management of Patients With Thoracic Aortic Disease. Circulation. 2010; 121: Z610-R604: [id number]. Aortic aneurysm NOS (ICD10-I71.9) 2.  Aortic Atherosclerosis (ICD10-I70.0). NONVASCULAR: 1. Scattered consolidative opacities in the dependent portions of the left upper and lower lobes as well as a smaller focus in the peripheral right lower lobe favored to represent sequela of aspiration given provided history and recent intubation. Multifocal pneumonia could appear similarly. 2. Endotracheal tube in place in the superior thoracic trachea. Consider tip advancement toward the  carina by 3-5 cm. 3. Gastric decompression tube tip appropriately positioned in the gastric body. Marliss Cootsylan Suttle, MD [facility] Electronically Signed   By: Marliss Cootsylan  Suttle MD   On: 01/14/2020 12:01    Microbiology No results found for this or any previous visit (from the past 240 hour(s)).  Lab Basic Metabolic Panel: No results for input(s): NA, K, CL, CO2, GLUCOSE, BUN, CREATININE, CALCIUM, MG, PHOS in the last 168 hours. Liver Function Tests: No results for input(s): AST, ALT, ALKPHOS, BILITOT, PROT, ALBUMIN in the last 168 hours. No results for input(s): LIPASE, AMYLASE in the last 168 hours. No results for input(s): AMMONIA in the last 168 hours. CBC: No results for input(s): WBC, NEUTROABS, HGB, HCT, MCV, PLT in the last 168 hours. Cardiac Enzymes: No results for input(s): CKTOTAL, CKMB, CKMBINDEX, TROPONINI in the last 168 hours. Sepsis Labs: No results for input(s): PROCALCITON, WBC, LATICACIDVEN in the last 168 hours.  Procedures/Operations  Mechanical ventilation   Bennette Hasty 01/25/2020, 10:54 AM

## 2020-02-16 NOTE — Progress Notes (Signed)
TOD [id number]. Verified with Sherral Hammers, RN.   75 cc Morphine wasted with Miachel Roux, RN.

## 2020-02-16 DEATH — deceased

## 2022-04-28 IMAGING — CT CT CERVICAL SPINE W/O CM
3 of 4 series · 12 of 33 positions shown, 14 images · non-contrast
Comparison: None.

CLINICAL DATA: Pain following fall

EXAM:
CT HEAD WITHOUT CONTRAST
CT CERVICAL SPINE WITHOUT CONTRAST
TECHNIQUE: Multidetector CT imaging of the head and cervical spine was
performed following the standard protocol without intravenous
contrast. Multiplanar CT image reconstructions of the cervical spine
were also generated.

[Series 4: c_spine 2.0 st · axial · 0.29mm/px · z∈[-312,-188]mm · 4 of 94 slices shown, 5 images]
[im 16/94  soft-tissue]
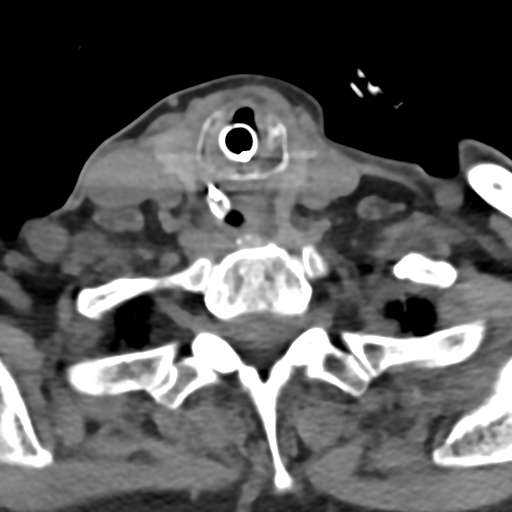
[im 16/94  bone]
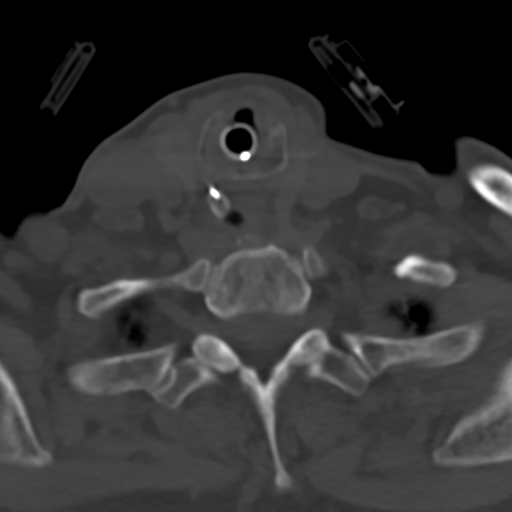
[im 32/94  bone]
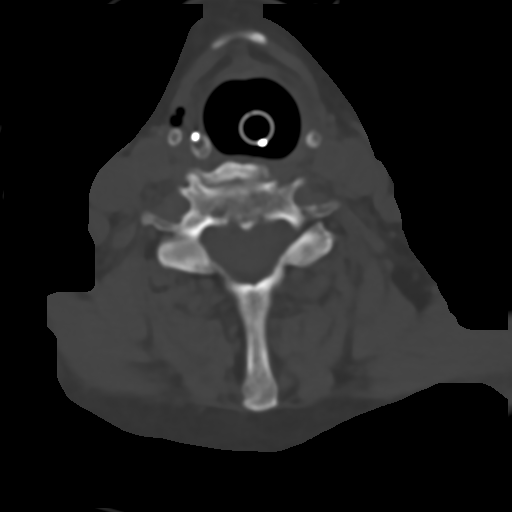
[im 63/94  bone]
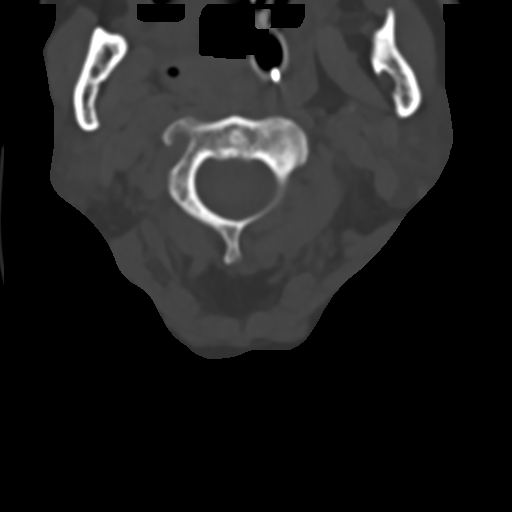
[im 78/94  bone]
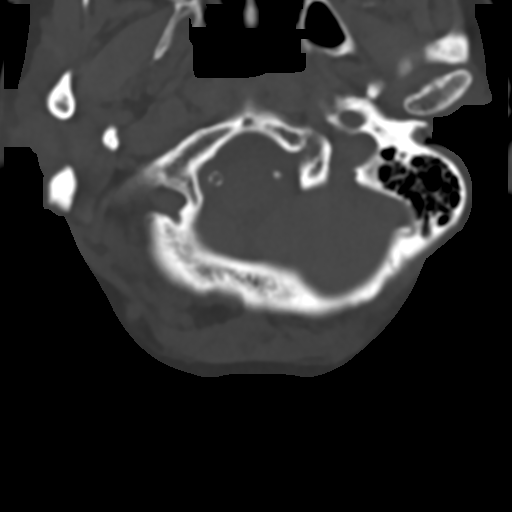

[Series 6: c_spine 2.0 sag bone · sagittal · 0.39mm/px · 5 of 47 slices shown, 6 images]
[im 16/47  bone]
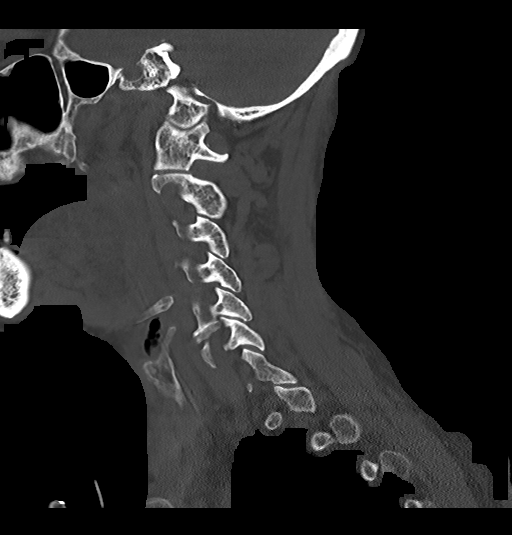
[im 20/47  bone]
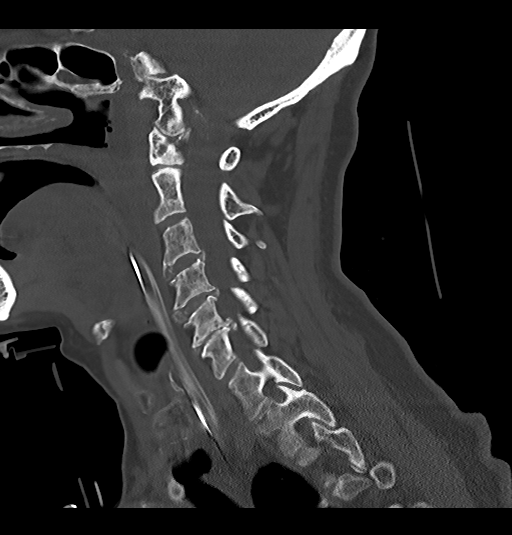
[im 24/47  soft-tissue]
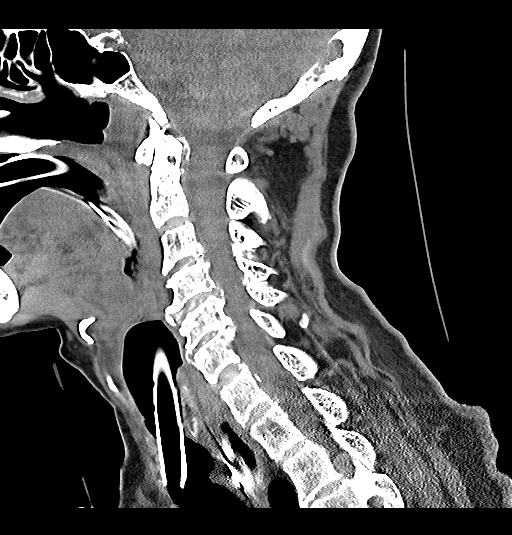
[im 24/47  bone]
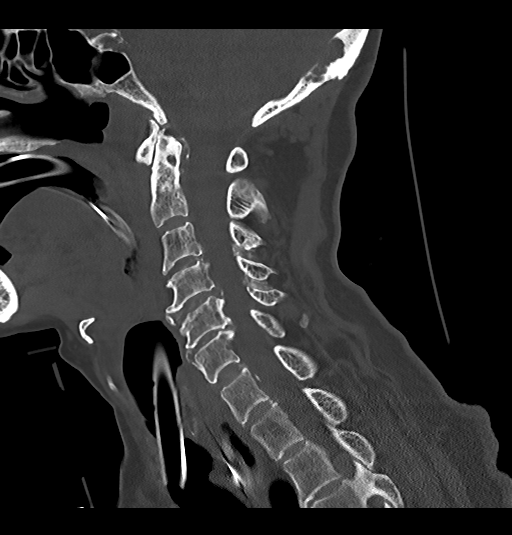
[im 27/47  bone]
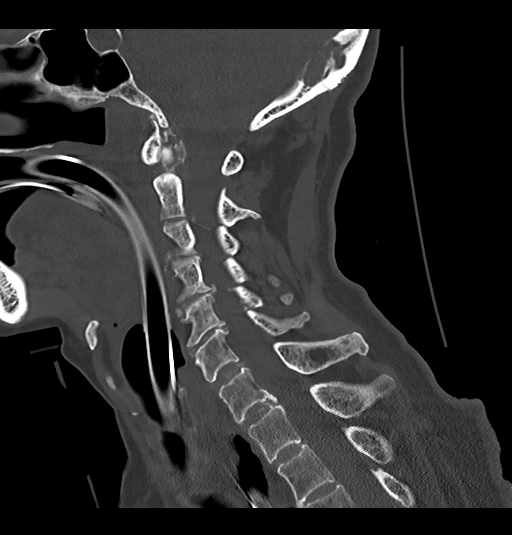
[im 31/47  bone]
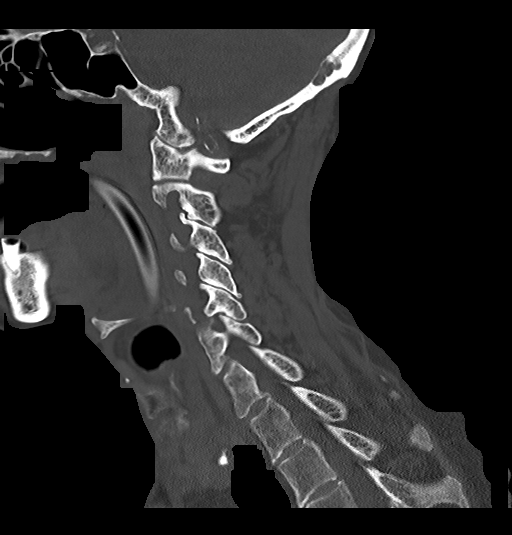

[Series 7: c_spine 2.0 cor bone · coronal · 0.31mm/px · 3 of 61 slices shown]
[im 13/61  bone]
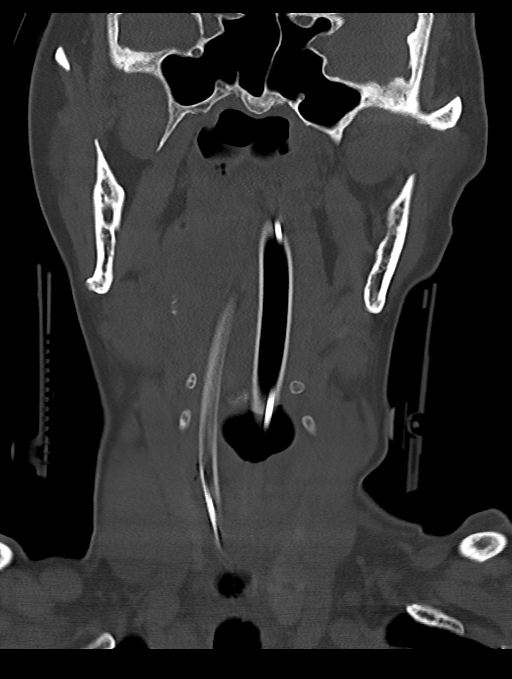
[im 25/61  bone]
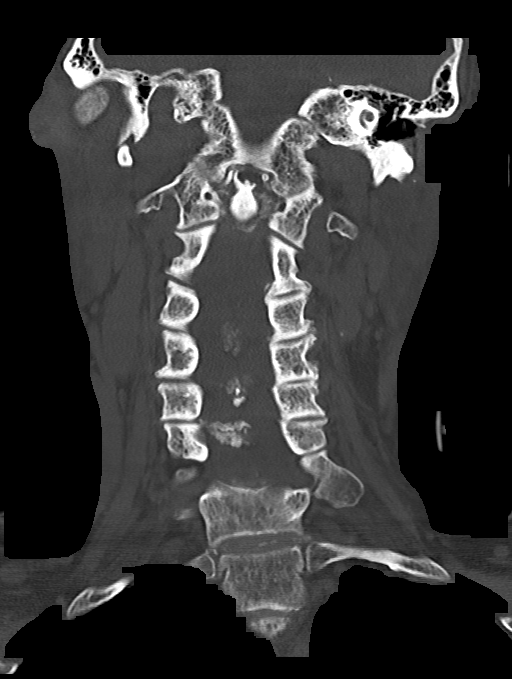
[im 37/61  bone]
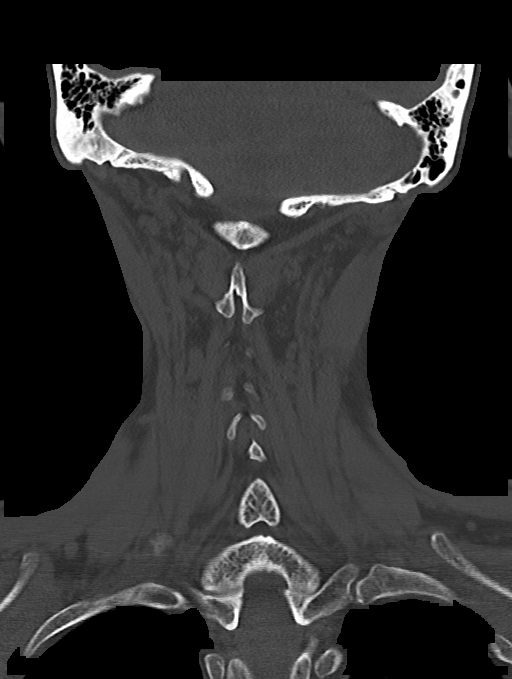

[12 of 33 positions shown; findings below may reference images not displayed]

FINDINGS: CT HEAD FINDINGS

Brain: There is extensive acute subdural hematoma seen on the left
involving the left temporal, left frontal, and left parietal lobes.
This subdural hematoma on the left is maximum in thickness in the
temporal region with a maximum measured thickness of 1.7 cm. This
subdural hematoma is causing 1.5 cm of midline shift toward the
right.

There is an apparent frontal contusion with hemorrhage and slight
surrounding edema anteriorly measuring 1.6 x 1.5 cm. There is
immediately adjacent subdural hemorrhage tracking along the falx.

There is subarachnoid hemorrhage in the right temporoparietal
junction region without appreciable mass effect.

More inferiorly, there is a left tentorial subdural hematoma. There
is subarachnoid hemorrhage in the left caudate nucleus tail region
in adjacent inferomedial left temporal lobe which is causing mass
effect on the uncus on the left. There is hemorrhage in the anterior
superior cerebellum which is felt to be subarachnoid with mild
surrounding edema measuring 2.3 x 1.2 cm. There is also subarachnoid
hemorrhage tracking from the lateral left midbrain into the upper
pons region.

Vascular: No hyperdense vessel. There is calcification in each
carotid siphon region.

Skull: There is a nondisplaced fracture of each anterior temporal
bone. No depressed skull fracture evident.

Sinuses/Orbits: Mucosal thickening noted in several ethmoid air
cells. Orbits appear symmetric bilaterally.

Other: Mastoid air cells are clear.

CT CERVICAL SPINE FINDINGS

Alignment: There is cervical dextroscoliosis. No evidence
spondylolisthesis.

Skull base and vertebrae: Skull base and craniocervical junction
regions unremarkable. There is pannus posterior to the odontoid
which is not causing significant impression on craniocervical
junction. No fracture is appreciable. No blastic or lytic bone
lesions.

Soft tissues and spinal canal: Prevertebral soft tissues and
predental space regions are within normal limits. Note that the
patient is intubated. No evident cord or canal hematoma. No
paraspinous lesions.

Disc levels: There is moderately severe disc space narrowing at
C5-6. There is milder disc space narrowing at C3-4 and C4-5. There
are prominent anterior and posterior osteophytes at C4, C5, and C6.
There is a degree spinal stenosis at C4-5 due to central calcified
osteophyte impressing on the ventral cord. There is spinal stenosis
due to large central and right paracentral calcified spurring and
disc protrusion at C5-6 with marked impression on the proximal C6
nerve root on the right due to bony hypertrophy and disc protrusion.
Facet hypertrophy noted at several levels.

Upper chest: Patient intubated. Mild scarring in the left upper
lobe. No pneumothorax evident. [place] edema or consolidation.

Other: Foci of calcification noted in each carotid artery.
IMPRESSION: CT head:

1. Acute subdural hematoma in the left frontal, left temporal, and
left parietal regions measuring maximum thickness in the temporal
region 1.7 cm. 1.5 cm of midline shift to the right at the lateral
and third ventricular level.

2.  Falcine and left tentorial acute subdural hematomas.

3. Brain contusion with hemorrhage in the anterior superior frontal
lobe, more severe on the left than on the right. This apparent
contusion measures 1.6 x 1.5 cm.

4. Subarachnoid hemorrhage in the right temporoparietal region
without mass effect.

5. Subarachnoid hemorrhage in the inferomedial left temporal lobe
involving a portion of the tail of the caudate nucleus on the left
causing mass effect on the left uncus. Subarachnoid hemorrhage seen
in the lateral lower left midbrain extending into the left upper
pons. Subarachnoid hemorrhage in the anterior upper right cerebellum
measuring 2.3 x 1.2 cm with surrounding edema causing mass effect on
the fourth ventricle.

6. Nondisplaced fractures of each anterior temporal bone. No
displaced skull fractures.

7.  Foci of arterial vascular calcification.

8.  Mucosal thickening in several ethmoid air cells.

CT cervical spine:

1.  No fracture.  No spondylolisthesis.  Scoliosis noted.

2. Multilevel arthropathy. Spinal stenosis due to bony hypertrophy
centrally at C4-5 and centrally and toward the right at C5-6. Marked
impression on the right C6 nerve root near its origin due to bony
hypertrophy.

3. Foci of arterial vascular calcification involving each carotid
artery.

Critical Value/emergent results were called by telephone at the time
of interpretation on 01/12/2020 at [DATE] to provider GRAND
GISEL , who verbally acknowledged these results.

## 2022-04-28 IMAGING — DX DG CHEST 1V PORT
1 series · 1 of 1 positions shown · non-contrast
Comparison: None.

CLINICAL DATA: Hypoxia.  Recent fall

EXAM:
PORTABLE CHEST 1 VIEW

[chest]
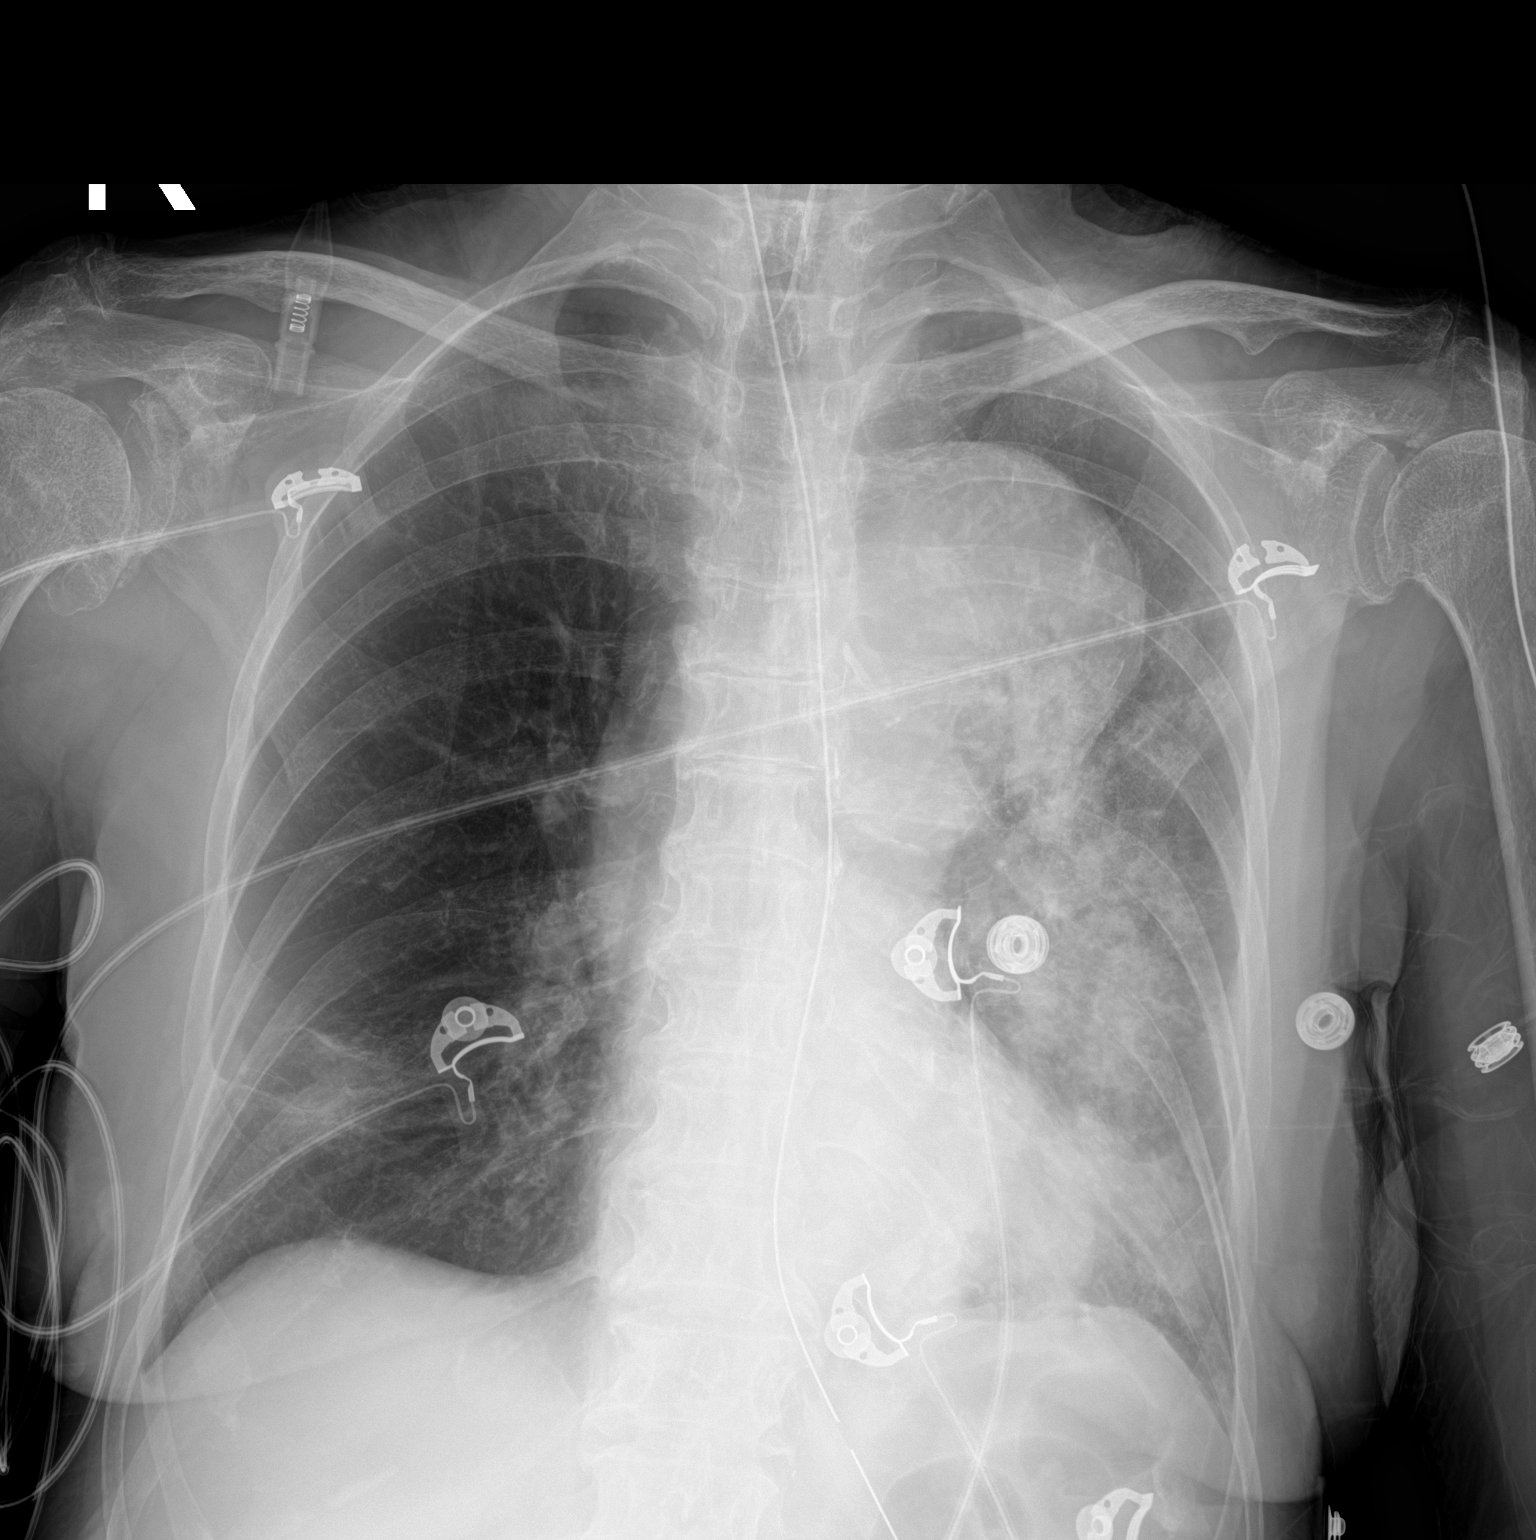

[1 of 1 positions shown; findings below may reference images not displayed]

FINDINGS: Endotracheal tube tip is 7.9 cm above the carina. Nasogastric tube
tip is below the diaphragm. The side port is immediately distal to
the gastroesophageal junction. No pneumothorax.

There is airspace opacity in left mid and lower lung regions as well
as in the right lower lung region.

Heart size normal. Pulmonary vascularity normal. There is dilatation
of the aortic arch and proximal descending thoracic aorta. There is
aortic atherosclerosis.

No pneumothorax.  Degenerative change in each shoulder.
IMPRESSION: 1.  Tube and catheter positions as described without pneumothorax.

2. Airspace opacity, likely multifocal pneumonia, seen in the left
mid and lower lung regions as well as in the right lower lung
region.

3. Enlargement of the aortic arch and proximal descending thoracic
aorta consistent with aneurysmal dilatation. Underlying dissection
cannot be excluded by radiography. This finding may warrant contrast
enhanced chest CT to further evaluate.

Aortic Atherosclerosis ([id number]).
